# Patient Record
Sex: Male | Born: 2000 | Hispanic: No | Marital: Single | Smoking: Current some day smoker
Health system: Southern US, Community
[De-identification: ages and names within clinical notes are randomized; demographics above are authoritative.]

---
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
## 2018-04-20 ENCOUNTER — Emergency Department (HOSPITAL_COMMUNITY)
Admission: EM | Admit: 2018-04-20 | Discharge: 2018-04-21 | Disposition: A | Discharge status: Home / Self Care | Payer: Medicaid Other | Attending: Emergency Medicine | Admitting: Emergency Medicine

## 2018-04-20 ENCOUNTER — Encounter (HOSPITAL_COMMUNITY): Payer: Self-pay | Admitting: Emergency Medicine

## 2018-04-20 ENCOUNTER — Emergency Department (HOSPITAL_COMMUNITY): Payer: Medicaid Other

## 2018-04-20 ENCOUNTER — Other Ambulatory Visit: Payer: Self-pay

## 2018-04-20 DIAGNOSIS — S20219A Contusion of unspecified front wall of thorax, initial encounter: Secondary | ICD-10-CM | POA: Diagnosis not present

## 2018-04-20 DIAGNOSIS — Y939 Activity, unspecified: Secondary | ICD-10-CM | POA: Insufficient documentation

## 2018-04-20 DIAGNOSIS — Y929 Unspecified place or not applicable: Secondary | ICD-10-CM | POA: Diagnosis not present

## 2018-04-20 DIAGNOSIS — R1032 Left lower quadrant pain: Secondary | ICD-10-CM | POA: Insufficient documentation

## 2018-04-20 DIAGNOSIS — Y999 Unspecified external cause status: Secondary | ICD-10-CM | POA: Insufficient documentation

## 2018-04-20 DIAGNOSIS — X58XXXA Exposure to other specified factors, initial encounter: Secondary | ICD-10-CM | POA: Diagnosis not present

## 2018-04-20 DIAGNOSIS — S299XXA Unspecified injury of thorax, initial encounter: Secondary | ICD-10-CM | POA: Diagnosis present

## 2018-04-20 LAB — URINALYSIS, ROUTINE W REFLEX MICROSCOPIC
BILIRUBIN URINE: NEGATIVE
GLUCOSE, UA: NEGATIVE mg/dL
HGB URINE DIPSTICK: NEGATIVE
KETONES UR: 5 mg/dL — AB
Leukocytes, UA: NEGATIVE
Nitrite: NEGATIVE
PROTEIN: NEGATIVE mg/dL
Specific Gravity, Urine: 1.019 (ref 1.005–1.030)
pH: 5 (ref 5.0–8.0)

## 2018-04-20 MED ORDER — IBUPROFEN 600 MG PO TABS: 600.0000 mg | ORAL_TABLET | Freq: Four times a day (QID) | ORAL | 0 refills | Status: DC | PRN

## 2018-04-20 NOTE — Discharge Instructions (Signed)
Return if any problems.
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
## 2018-04-20 NOTE — ED Notes (Signed)
ED Provider at bedside. 

## 2020-12-17 ENCOUNTER — Encounter (HOSPITAL_COMMUNITY): Payer: Self-pay

## 2020-12-17 ENCOUNTER — Emergency Department (HOSPITAL_COMMUNITY): Payer: Medicaid Other

## 2020-12-17 ENCOUNTER — Emergency Department (HOSPITAL_COMMUNITY)
Admission: EM | Admit: 2020-12-17 | Discharge: 2020-12-17 | Discharge status: Against Medical Advice | Payer: Medicaid Other | Attending: Physician Assistant | Admitting: Physician Assistant

## 2020-12-17 ENCOUNTER — Other Ambulatory Visit: Payer: Self-pay

## 2020-12-17 DIAGNOSIS — M25531 Pain in right wrist: Secondary | ICD-10-CM | POA: Insufficient documentation

## 2020-12-17 DIAGNOSIS — W3189XA Contact with other specified machinery, initial encounter: Secondary | ICD-10-CM | POA: Diagnosis not present

## 2020-12-17 DIAGNOSIS — R202 Paresthesia of skin: Secondary | ICD-10-CM | POA: Insufficient documentation

## 2020-12-17 DIAGNOSIS — Z5321 Procedure and treatment not carried out due to patient leaving prior to being seen by health care provider: Secondary | ICD-10-CM | POA: Diagnosis not present

## 2020-12-17 NOTE — ED Notes (Signed)
Called pt to take to room. Unable to locate
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
## 2020-12-17 NOTE — ED Provider Notes (Signed)
Emergency Medicine Provider Triage Evaluation Note  Noah [name] , a 20 y.o. male  was evaluated in triage.  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 Felt "bones pop on both sides of the wrist". Significant pain with movement. H/o fracture to the same wrist several years ago. No laceration   Review of Systems  Positive: As above Negative: As above  Physical Exam  There were no vitals taken for this visit. Gen:   Awake, no distress   Resp:  Normal effort  MSK:   right wrist swelling, 2+ radial pulses, sensations intact Other:  Medical Decision Making  Medically screening exam initiated at 2:35 PM.  Appropriate orders placed.  Carlena Hurl was informed that the remainder of the evaluation will be completed by another provider, this initial triage assessment does not replace that evaluation, and the importance of remaining in the ED until their evaluation is complete.     Mare Ferrari, PA-C 12/17/20 1438    Ernie Avena, MD 12/17/20 2146

## 2020-12-17 NOTE — ED Notes (Signed)
Called patient to move to room, unable to locate at this time. 

## 2021-01-17 ENCOUNTER — Emergency Department (EMERGENCY_DEPARTMENT_HOSPITAL)
Admission: EM | Admit: 2021-01-17 | Discharge: 2021-01-18 | Disposition: A | Discharge status: Other Acute Inpatient Hospital | Payer: Medicaid Other | Source: Home / Self Care | Attending: Emergency Medicine | Admitting: Emergency Medicine

## 2021-01-17 ENCOUNTER — Other Ambulatory Visit: Payer: Self-pay

## 2021-01-17 DIAGNOSIS — F29 Unspecified psychosis not due to a substance or known physiological condition: Secondary | ICD-10-CM | POA: Insufficient documentation

## 2021-01-17 DIAGNOSIS — R441 Visual hallucinations: Secondary | ICD-10-CM | POA: Insufficient documentation

## 2021-01-17 DIAGNOSIS — F3010 Manic episode without psychotic symptoms, unspecified: Secondary | ICD-10-CM

## 2021-01-17 DIAGNOSIS — Z20822 Contact with and (suspected) exposure to covid-19: Secondary | ICD-10-CM | POA: Insufficient documentation

## 2021-01-17 DIAGNOSIS — F309 Manic episode, unspecified: Secondary | ICD-10-CM | POA: Insufficient documentation

## 2021-01-17 DIAGNOSIS — F1910 Other psychoactive substance abuse, uncomplicated: Secondary | ICD-10-CM | POA: Insufficient documentation

## 2021-01-17 DIAGNOSIS — Z79899 Other long term (current) drug therapy: Secondary | ICD-10-CM | POA: Insufficient documentation

## 2021-01-17 DIAGNOSIS — R440 Auditory hallucinations: Secondary | ICD-10-CM | POA: Insufficient documentation

## 2021-01-17 DIAGNOSIS — F23 Brief psychotic disorder: Secondary | ICD-10-CM

## 2021-01-17 LAB — COMPREHENSIVE METABOLIC PANEL
ALT: 17 U/L (ref 0–44)
AST: 26 U/L (ref 15–41)
Albumin: 4.9 g/dL (ref 3.5–5.0)
Alkaline Phosphatase: 65 U/L (ref 38–126)
Anion gap: 11 (ref 5–15)
BUN: 25 mg/dL — ABNORMAL HIGH (ref 6–20)
CO2: 22 mmol/L (ref 22–32)
Calcium: 9.4 mg/dL (ref 8.9–10.3)
Chloride: 102 mmol/L (ref 98–111)
Creatinine, Ser: 1.1 mg/dL (ref 0.61–1.24)
GFR, Estimated: >60 mL/min (ref >60)
Glucose, Bld: 100 mg/dL — ABNORMAL HIGH (ref 70–99)
Potassium: 3.8 mmol/L (ref 3.5–5.1)
Sodium: 135 mmol/L (ref 135–145)
Total Bilirubin: 1 mg/dL (ref 0.3–1.2)
Total Protein: 7.9 g/dL (ref 6.5–8.1)

## 2021-01-17 LAB — CBC
HCT: 42.3 % (ref 39.0–52.0)
Hemoglobin: 14.9 g/dL (ref 13.0–17.0)
MCH: 33.2 pg (ref 26.0–34.0)
MCHC: 35.2 g/dL (ref 30.0–36.0)
MCV: 94.2 fL (ref 80.0–100.0)
Platelets: 274 10*3/uL (ref 150–400)
RBC: 4.49 MIL/uL (ref 4.22–5.81)
RDW: 12.7 % (ref 11.5–15.5)
WBC: 10.7 10*3/uL — ABNORMAL HIGH (ref 4.0–10.5)
nRBC: 0 % (ref 0.0–0.2)

## 2021-01-17 LAB — ETHANOL: Alcohol, Ethyl (B): 12 mg/dL — ABNORMAL HIGH (ref <10)

## 2021-01-17 LAB — SALICYLATE LEVEL: Salicylate Lvl: <7 mg/dL — ABNORMAL LOW (ref 7.0–30.0)

## 2021-01-17 LAB — ACETAMINOPHEN LEVEL: Acetaminophen (Tylenol), Serum: <10 ug/mL — ABNORMAL LOW (ref 10–30)

## 2021-01-17 MED ORDER — OLANZAPINE 5 MG PO TBDP
10.0000 mg | ORAL_TABLET | Freq: Once | ORAL | Status: AC
  Administered 2021-01-17: 10 mg via ORAL
  Filled 2021-01-17: qty 2

## 2021-01-17 MED ORDER — LORAZEPAM 2 MG PO TABS
2.0000 mg | ORAL_TABLET | Freq: Once | ORAL | Status: AC
  Administered 2021-01-17: 2 mg via ORAL
  Filled 2021-01-17: qty 1

## 2021-01-17 NOTE — ED Notes (Signed)
Earring placed in urine cup in belonging bag

## 2021-01-17 NOTE — ED Provider Notes (Signed)
[facility] Emergency Department Provider Note ____________________________________________   Event Date/Time   First MD Initiated Contact with Patient 01/17/21 1919     (approximate)  I have reviewed the triage vital signs and the nursing notes.  HISTORY  Chief Complaint Hallucinations   HPI Noah [name] is a 20 y.o. malewho presents to the ED for evaluation of audiovisual hallucinations.  Chart review indicates no relevant history.  Patient presents to the ED voluntarily with pressured speech, hallucinations and disorganized thoughts.  He is quite difficult to follow, making history acquisition difficult.  He reports smoking methamphetamines as recently as a couple days ago.  Also reports smoking cannabis.  Reports cocaine as recently as last year.  Adamantly denies any IVDU.  Denies SI or HI, but does report audiovisual hallucinations.  Lives with his grandparents, who are doing great.  No past medical history on file.  There are no problems to display for this patient.   No past surgical history on file.  Prior to Admission medications   Medication Sig Start Date End Date Taking? Authorizing Provider  ibuprofen (ADVIL,MOTRIN) 600 MG tablet Take 1 tablet (600 mg total) by mouth every 6 (six) hours as needed. 04/20/18   Elson Areas, PA-C    Allergies Patient has no known allergies.  No family history on file.  Social History Social History   Tobacco Use   Smoking status: Never   Smokeless tobacco: Never  Vaping Use   Vaping Use: Every day  Substance Use Topics   Alcohol use: No   Drug use: No    Review of Systems  Difficult to accurately obtain due to patient's pressured speech and disorganized thought processes. ____________________________________________   PHYSICAL EXAM:  VITAL SIGNS: Vitals:   01/17/21 1819  BP: (!) 142/100  Pulse: (!) 102  Resp: 20  Temp: 98.7 F (37.1 C)  SpO2: 98%     Constitutional:  Alert .  Slightly disheveled and difficult to follow.  Very pressured speech with tangential thought processes. Eyes: Conjunctivae are normal. PERRL. EOMI. Head: Atraumatic. Nose: No congestion/rhinnorhea. Mouth/Throat: Mucous membranes are dry.  Oropharynx non-erythematous. Neck: No stridor. No cervical spine tenderness to palpation. Cardiovascular: Normal rate, regular rhythm. Grossly normal heart sounds.  Good peripheral circulation. Respiratory: Normal respiratory effort.  No retractions. Lungs CTAB. Gastrointestinal: Soft , nondistended, nontender to palpation. No CVA tenderness. Musculoskeletal: No lower extremity tenderness nor edema.  No joint effusions. No signs of acute trauma.  No track marks noted Neurologic:  No gross focal neurologic deficits are appreciated.  Skin:  Skin is warm, dry and intact. No rash noted. Psychiatric: Mood and affect are elevated. Speech and behavior are pressured and disorganized  ____________________________________________   LABS (all labs ordered are listed, but only abnormal results are displayed)  Labs Reviewed  COMPREHENSIVE METABOLIC PANEL - Abnormal; Notable for the following components:      Result Value   Glucose, Bld 100 (*)    BUN 25 (*)    All other components within normal limits  ETHANOL - Abnormal; Notable for the following components:   Alcohol, Ethyl (B) 12 (*)    All other components within normal limits  SALICYLATE LEVEL - Abnormal; Notable for the following components:   Salicylate Lvl <7.0 (*)    All other components within normal limits  ACETAMINOPHEN LEVEL - Abnormal; Notable for the following components:   Acetaminophen (Tylenol), Serum <10 (*)    All other components within normal limits  CBC -  Abnormal; Notable for the following components:   WBC 10.7 (*)    All other components within normal limits  RESP PANEL BY RT-PCR (FLU A&B, COVID) ARPGX2  URINE DRUG SCREEN, QUALITATIVE ([facility] ONLY)    ____________________________________________  12 Lead EKG   ____________________________________________  RADIOLOGY  ED MD interpretation:    Official radiology report(s): No results found.  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  No evidence of medical pathology to preclude psychiatric evaluation and disposition.  Blood work is benign.  He calmed down with some Ativan and Zyprexa p.o.  We will hold under IVC for psychiatric valuation.  Clinical Course as of 01/17/21 2150  Tue Jan 17, 2021  2147 The patient has been placed in psychiatric observation due to the need to provide a safe environment for the patient while obtaining psychiatric consultation and evaluation, as well as ongoing medical and medication management to treat the patient's condition.  The patient has been placed under full IVC at this time.   [DS]    Clinical Course User Index [DS] Delton Prairie, MD    ____________________________________________   FINAL CLINICAL IMPRESSION(S) / ED DIAGNOSES  Final diagnoses:  Manic behavior [facility])     ED Discharge Orders     None        Seanpaul Preece   Note:  This document was prepared using Dragon voice recognition software and may include unintentional dictation errors.    Delton Prairie, MD 01/17/21 2150

## 2021-01-17 NOTE — ED Notes (Signed)
Patient in room screaming and shouting. Writer went into room to try and calm patient. Patient Field seismologist, "Bitch" patient punching bed and walls and appears to be responding to internal stimuli. Once time order for zyprexa given per order.

## 2021-01-17 NOTE — ED Notes (Signed)
Orange Shirt, Audiological scientist, black belt, black and white shorts, black boxers, white socks, black slides

## 2021-01-17 NOTE — ED Notes (Signed)
Pt. Alert and oriented, warm and dry, in no distress. Pt. Denies SI, HI. Patient states having hallucinations. Patient states seeing multiple people and hearing voices. Patient has rapid speech that is pressured. Patient states he only had a sip of beer and last smoked weed a day or so ago. Writer explained the process to patient, and patient was agreeable.  Pt. Encouraged to let nursing staff know of any concerns or needs.

## 2021-01-17 NOTE — ED Triage Notes (Signed)
Pt is rambling and states that he is having hallucinations. He is tearful and  not sure what he is here for. He is talking to people that are not here. Pt reports that he has not taken any medication and that he has been clean for "awhile"

## 2021-01-18 ENCOUNTER — Inpatient Hospital Stay: Admission: AD | Admit: 2021-01-18 | Payer: Medicaid Other | Source: Intra-hospital | Admitting: Behavioral Health

## 2021-01-18 ENCOUNTER — Encounter: Payer: Self-pay | Admitting: Behavioral Health

## 2021-01-18 ENCOUNTER — Inpatient Hospital Stay
Admission: AD | Admit: 2021-01-18 | Discharge: 2021-02-01 | DRG: 885 | Disposition: A | Discharge status: Home / Self Care | Payer: Medicaid Other | Source: Intra-hospital | Attending: Behavioral Health | Admitting: Behavioral Health

## 2021-01-18 DIAGNOSIS — S90821A Blister (nonthermal), right foot, initial encounter: Secondary | ICD-10-CM | POA: Diagnosis present

## 2021-01-18 DIAGNOSIS — F1910 Other psychoactive substance abuse, uncomplicated: Secondary | ICD-10-CM

## 2021-01-18 DIAGNOSIS — F23 Brief psychotic disorder: Principal | ICD-10-CM | POA: Diagnosis present

## 2021-01-18 DIAGNOSIS — F17210 Nicotine dependence, cigarettes, uncomplicated: Secondary | ICD-10-CM | POA: Diagnosis present

## 2021-01-18 DIAGNOSIS — Z20822 Contact with and (suspected) exposure to covid-19: Secondary | ICD-10-CM | POA: Diagnosis present

## 2021-01-18 DIAGNOSIS — S90822A Blister (nonthermal), left foot, initial encounter: Secondary | ICD-10-CM | POA: Diagnosis present

## 2021-01-18 DIAGNOSIS — G4700 Insomnia, unspecified: Secondary | ICD-10-CM | POA: Diagnosis present

## 2021-01-18 LAB — RESP PANEL BY RT-PCR (FLU A&B, COVID) ARPGX2
Influenza A by PCR: NEGATIVE
Influenza B by PCR: NEGATIVE
SARS Coronavirus 2 by RT PCR: NEGATIVE

## 2021-01-18 MED ORDER — OLANZAPINE 5 MG PO TBDP
10.0000 mg | ORAL_TABLET | Freq: Two times a day (BID) | ORAL | Status: DC
  Administered 2021-01-19 – 2021-01-21 (×5): 10 mg via ORAL
  Filled 2021-01-18 (×5): qty 2

## 2021-01-18 MED ORDER — ZIPRASIDONE MESYLATE 20 MG IM SOLR: 20.0000 mg | Freq: Four times a day (QID) | INTRAMUSCULAR | Status: DC | PRN

## 2021-01-18 MED ORDER — IBUPROFEN 600 MG PO TABS
600.0000 mg | ORAL_TABLET | Freq: Four times a day (QID) | ORAL | Status: DC | PRN
  Administered 2021-01-19 – 2021-02-01 (×7): 600 mg via ORAL
  Filled 2021-01-18 (×8): qty 1

## 2021-01-18 MED ORDER — HYDROXYZINE HCL 50 MG PO TABS
50.0000 mg | ORAL_TABLET | Freq: Three times a day (TID) | ORAL | Status: DC | PRN
  Administered 2021-01-19 – 2021-02-01 (×27): 50 mg via ORAL
  Filled 2021-01-18 (×31): qty 1

## 2021-01-18 MED ORDER — OLANZAPINE 5 MG PO TBDP
10.0000 mg | ORAL_TABLET | Freq: Two times a day (BID) | ORAL | Status: DC
  Administered 2021-01-18: 10 mg via ORAL
  Filled 2021-01-18: qty 2

## 2021-01-18 MED ORDER — ALUM & MAG HYDROXIDE-SIMETH 200-200-20 MG/5ML PO SUSP
30.0000 mL | ORAL | Status: DC | PRN
  Administered 2021-01-24: 30 mL via ORAL
  Filled 2021-01-18: qty 30

## 2021-01-18 MED ORDER — TRAZODONE HCL 100 MG PO TABS
100.0000 mg | ORAL_TABLET | Freq: Every evening | ORAL | Status: DC | PRN
  Administered 2021-01-19 – 2021-01-31 (×13): 100 mg via ORAL
  Filled 2021-01-18 (×14): qty 1

## 2021-01-18 MED ORDER — MAGNESIUM HYDROXIDE 400 MG/5ML PO SUSP: 30.0000 mL | Freq: Every day | ORAL | Status: DC | PRN

## 2021-01-18 MED ORDER — OLANZAPINE 10 MG PO TABS
10.0000 mg | ORAL_TABLET | Freq: Four times a day (QID) | ORAL | Status: DC | PRN
  Administered 2021-01-19 – 2021-01-31 (×12): 10 mg via ORAL
  Filled 2021-01-18 (×13): qty 1

## 2021-01-18 NOTE — Tx Team (Signed)
Initial Treatment Plan 01/18/2021 3:01 PM Noah [name] [id number]    PATIENT STRESSORS: Medication change or noncompliance   Substance abuse     PATIENT STRENGTHS: Ability for insight  Communication skills  Physical Health  Supportive family/friends    PATIENT IDENTIFIED PROBLEMS: Psychosis    Substance abuse                  DISCHARGE CRITERIA:  Ability to meet basic life and health needs Improved stabilization in mood, thinking, and/or behavior Motivation to continue treatment in a less acute level of care Verbal commitment to aftercare and medication compliance  PRELIMINARY DISCHARGE PLAN: Outpatient therapy Return to previous living arrangement  PATIENT/FAMILY INVOLVEMENT: This treatment plan has been presented to and reviewed with the patient, Noah [name], .  The patient has been given the opportunity to ask questions and make suggestions.  Chalmers Cater, RN 01/18/2021, 3:01 PM

## 2021-01-18 NOTE — Plan of Care (Signed)
New admission Problem: Education: Goal: Knowledge of [facility] Education information/materials will improve Outcome: Not Progressing Goal: Emotional status will improve Outcome: Not Progressing Goal: Mental status will improve Outcome: Not Progressing Goal: Verbalization of understanding the information provided will improve Outcome: Not Progressing   Problem: Activity: Goal: Interest or engagement in activities will improve Outcome: Not Progressing Goal: Sleeping patterns will improve Outcome: Not Progressing   Problem: Coping: Goal: Ability to verbalize frustrations and anger appropriately will improve Outcome: Not Progressing Goal: Ability to demonstrate self-control will improve Outcome: Not Progressing   Problem: Health Behavior/Discharge Planning: Goal: Identification of resources available to assist in meeting health care needs will improve Outcome: Not Progressing Goal: Compliance with treatment plan for underlying cause of condition will improve Outcome: Not Progressing   Problem: Physical Regulation: Goal: Ability to maintain clinical measurements within normal limits will improve Outcome: Not Progressing   Problem: Safety: Goal: Periods of time without injury will increase Outcome: Not Progressing   Problem: Activity: Goal: Will verbalize the importance of balancing activity with adequate rest periods Outcome: Not Progressing   Problem: Education: Goal: Will be free of psychotic symptoms Outcome: Not Progressing Goal: Knowledge of the prescribed therapeutic regimen will improve Outcome: Not Progressing   Problem: Coping: Goal: Coping ability will improve Outcome: Not Progressing Goal: Will verbalize feelings Outcome: Not Progressing   Problem: Health Behavior/Discharge Planning: Goal: Compliance with prescribed medication regimen will improve Outcome: Not Progressing   Problem: Nutritional: Goal: Ability to achieve adequate nutritional  intake will improve Outcome: Not Progressing   Problem: Role Relationship: Goal: Ability to communicate needs accurately will improve Outcome: Not Progressing Goal: Ability to interact with others will improve Outcome: Not Progressing   Problem: Safety: Goal: Ability to redirect hostility and anger into socially appropriate behaviors will improve Outcome: Not Progressing Goal: Ability to remain free from injury will improve Outcome: Not Progressing   Problem: Self-Care: Goal: Ability to participate in self-care as condition permits will improve Outcome: Not Progressing   Problem: Self-Concept: Goal: Will verbalize positive feelings about self Outcome: Not Progressing   Problem: Education: Goal: Knowledge of disease or condition will improve Outcome: Not Progressing Goal: Understanding of discharge needs will improve Outcome: Not Progressing   Problem: Health Behavior/Discharge Planning: Goal: Ability to identify changes in lifestyle to reduce recurrence of condition will improve Outcome: Not Progressing Goal: Identification of resources available to assist in meeting health care needs will improve Outcome: Not Progressing   Problem: Physical Regulation: Goal: Complications related to the disease process, condition or treatment will be avoided or minimized Outcome: Not Progressing   Problem: Safety: Goal: Ability to remain free from injury will improve Outcome: Not Progressing

## 2021-01-18 NOTE — ED Notes (Signed)
Report to Amanda, RN in BMU. 

## 2021-01-18 NOTE — BH Assessment (Signed)
Comprehensive Clinical Assessment (CCA) Screening, Triage and Referral Note  01/18/2021 Noah [name] [id number]  Noah [name], 20 year old male who presents to [facility] ED voluntarily for treatment. Per triage note, Pt is rambling and states that he is having hallucinations. He is tearful and  not sure what he is here for. He is talking to people that are not here. Pt reports that he has not taken any medication and that he has been clean for "awhile".   During TTS assessment pt presents alert and oriented x 4, restless but cooperative, and mood-congruent with affect. The pt appears to be responding to internal stimuli. Pt is presenting with delusional thinking.   During the interview it was very difficult to understand patient. Patient presented with rapid speech and became very agitated as he was talking. Patient reports he is not taking any current medications; however, when asking about substance use, patient was unclear. At one point patient would say he recently used Meth, marijuana and took a couple of Xanacs off the street but in another statement he would say he was "clean".  Patient denies SI yet he was saying that he wanted to hurt someone named [place]. Patient reports INPT hx at [facility] and he does not see a therapist for outpatient treatment. Patient was labile and upset throughout the assessment. Patient was looking past Psych Team as if someone else was in the room.     Per Dr. Toni Amend pt is recommended for inpatient psychiatric admission.    Chief Complaint:  No chief complaint on file.  Visit Diagnosis: Acute psychosis  Patient Reported Information How did you hear about [place]? Self  What Is the Reason for Your Visit/Call Today? Patient was brought to the ED for having hallucinations.  How Long Has This Been Causing You Problems? <Week  What Do You Feel Would Help You the Most Today? Medication(s) (Assessment)   Have You Recently Had Any Thoughts About Hurting Yourself?  No  Are You Planning to Commit Suicide/Harm Yourself At This time? No   Have you Recently Had Thoughts About Hurting Someone Karolee Ohs? Yes  Are You Planning to Harm Someone at This Time? No  Explanation: No data recorded  Have You Used Any Alcohol or Drugs in the Past 24 Hours? No data recorded How Long Ago Did You Use Drugs or Alcohol? No data recorded What Did You Use and How Much? No data recorded  Do You Currently Have a Therapist/Psychiatrist? No  Name of Therapist/Psychiatrist: No data recorded  Have You Been Recently Discharged From Any Office Practice or Programs? No  Explanation of Discharge From Practice/Program: No data recorded   CCA Screening Triage Referral Assessment Type of Contact: Face-to-Face  Telemedicine Service Delivery:   Is this Initial or Reassessment? No data recorded Date Telepsych consult ordered in [facility]:  No data recorded Time Telepsych consult ordered in [facility]:  No data recorded Location of Assessment: [facility] ED  Provider Location: [facility] ED   Collateral Involvement: None provided   Does Patient Have a Court Appointed Legal Guardian? No data recorded Name and Contact of Legal Guardian: No data recorded If Minor and Not Living with Parent(s), Who has Custody? n/a  Is CPS involved or ever been involved? No data recorded Is APS involved or ever been involved? No data recorded  Patient Determined To Be At Risk for Harm To Self or Others Based on Review of Patient Reported Information or Presenting Complaint? No  Method: No data recorded Availability of Means:  No data recorded Intent: No data recorded Notification Required: No data recorded Additional Information for Danger to Others Potential: No data recorded Additional Comments for Danger to Others Potential: No data recorded Are There Guns or Other Weapons in Your Home? No data recorded Types of Guns/Weapons: No data recorded Are These Weapons Safely Secured?                            No data  recorded Who Could Verify You Are Able To Have These Secured: No data recorded Do You Have any Outstanding Charges, Pending Court Dates, Parole/Probation? No data recorded Contacted To Inform of Risk of Harm To Self or Others: No data recorded  Does Patient Present under Involuntary Commitment? Yes  IVC Papers Initial File Date: 01/18/21   [place] of Residence: [place]   Patient Currently Receiving the Following Services: Not Receiving Services   Determination of Need: Urgent (48 hours)   Options For Referral: ED Visit; Inpatient Hospitalization; Medication Management   Discharge Disposition:     Clerance Lav, Counselor, LCAS-A

## 2021-01-18 NOTE — ED Notes (Signed)
Meal given to pt.

## 2021-01-18 NOTE — ED Notes (Signed)
IVC, pending consult 

## 2021-01-18 NOTE — Consult Note (Signed)
[facility]   Reason for Consult: Consult for 20 year old man came to the emergency room last night very agitated disorganized with evident hallucinations and psychotic symptoms. Referring Physician: Katrinka BlazingSmith Patient Identification: Noah [name] Nest MRN:  [id number] Principal Diagnosis: Acute psychosis (HCC) Diagnosis:  Principal Problem:   Acute psychosis (HCC) Active Problems:   Polysubstance abuse (HCC)   Total Time spent with patient: 1 hour  Subjective:   Noah [name] Warn is a 20 y.o. male patient admitted with "it is [place] and all of that shit".  HPI: Patient seen chart reviewed.  20 year old came to the emergency room last night initially apparently voluntarily.  Was described as emotionally labile and agitated.  On interview today the patient was showing a very labile affect.  Expressing anger.  Rapid pressured speech.  Only partially understandable.  Appears to be delusional and responding to internal stimuli.  Frequently turning around addressing different parts of the room.  Talking about Rudell CobbFreddie and Barbara CowerJason and horror movies.  Patient is unable to give a very clear history about substance abuse.  He tells us that he has been "clean" for "a while" but then starts talking about using meth and weed and taking pills like Xanax is that he gets off the street.  No drug screen has been done yet.  Patient is denying suicidal ideation.  Asked about homicidal ideation he says that he does and starts talking about somebody named Clide CliffRicky but will not answer questions about who that is.  Patient denies currently being on any medication.  Past Psychiatric History: We do not have any psychiatric records available but the patient indicates he has had psychiatric treatment in the fact past.  [facility].  Could not name any medications.  Risk to Self:   Risk to Others:   Prior Inpatient Therapy:   Prior Outpatient Therapy:    Past  Medical History: No past medical history on file. No past surgical history on file. Family History: No family history on file. Family Psychiatric  History: Unknown Social History:  Social History   Substance and Sexual Activity  Alcohol Use No     Social History   Substance and Sexual Activity  Drug Use No    Social History   Socioeconomic History   Marital status: Single    Spouse name: Not on file   Number of children: Not on file   Years of education: Not on file   Highest education level: Not on file  Occupational History   Not on file  Tobacco Use   Smoking status: Never   Smokeless tobacco: Never  Vaping Use   Vaping Use: Every day  Substance and Sexual Activity   Alcohol use: No   Drug use: No   Sexual activity: Not on file  Other Topics Concern   Not on file  Social History Narrative   Not on file   Social Determinants of Health   Financial Resource Strain: Not on file  Food Insecurity: Not on file  Transportation Needs: Not on file  Physical Activity: Not on file  Stress: Not on file  Social Connections: Not on file   Additional Social History:    Allergies:  No Known Allergies  Labs:  Results for orders placed or performed during the hospital encounter of 01/17/21 (from the past 48 hour(s))  Comprehensive metabolic panel     Status: Abnormal   Collection Time: 01/17/21  6:25 PM  Result Value Ref Range   Sodium 135 135 - 145 mmol/[name]   Potassium 3.8 3.5 - 5.1 mmol/[name]   Chloride 102 98 - 111 mmol/[name]   CO2 22 22 - 32 mmol/[name]   Glucose, Bld 100 (H) 70 - 99 mg/dL    Comment: Glucose reference range applies only to samples taken after fasting for at least 8 hours.   BUN 25 (H) 6 - 20 mg/dL   Creatinine, Ser 0.60 0.61 - 1.24 mg/dL   Calcium 9.4 8.9 - 04.5 mg/dL   Total Protein 7.9 6.5 - 8.1 g/dL   Albumin 4.9 3.5 - 5.0 g/dL   AST 26 15 - 41 U/[name]   ALT 17 0 - 44 U/[name]   Alkaline Phosphatase 65 38 - 126 U/[name]   Total Bilirubin 1.0 0.3 - 1.2 mg/dL   GFR,  Estimated >99 >77 mL/min    Comment: (NOTE) Calculated using the CKD-EPI Creatinine Equation (2021)    Anion gap 11 5 - 15    Comment: Performed at [facility], [address], [place], [place] [address]  Ethanol     Status: Abnormal   Collection Time: 01/17/21  6:25 PM  Result Value Ref Range   Alcohol, Ethyl (B) 12 (H) <10 mg/dL    Comment: (NOTE) Lowest detectable limit for serum alcohol is 10 mg/dL.  For medical purposes only. Performed at [facility], [address] [place], [place] [address]   Salicylate level     Status: Abnormal   Collection Time: 01/17/21  6:25 PM  Result Value Ref Range   Salicylate Lvl <7.0 ([name]) 7.0 - 30.0 mg/dL    Comment: Performed at [facility], [address] [place], [place] [address]  Acetaminophen level     Status: Abnormal   Collection Time: 01/17/21  6:25 PM  Result Value Ref Range   Acetaminophen (Tylenol), Serum <10 ([name]) 10 - 30 ug/mL    Comment: (NOTE) Therapeutic concentrations vary significantly. A range of 10-30 ug/mL  may be an effective concentration for many patients. However, some  are best treated at concentrations outside of this range. Acetaminophen concentrations >150 ug/mL at 4 hours after ingestion  and >50 ug/mL at 12 hours after ingestion are often associated with  toxic reactions.  Performed at [facility], [address] [place], [place] [address]   cbc     Status: Abnormal   Collection Time: 01/17/21  6:25 PM  Result Value Ref Range   WBC 10.7 (H) 4.0 - 10.5 K/uL   RBC 4.49 4.22 - 5.81 MIL/uL   Hemoglobin 14.9 13.0 - 17.0 g/dL   HCT 68.3 72.9 - 02.1 %   MCV 94.2 80.0 - 100.0 fL   MCH 33.2 26.0 - 34.0 pg   MCHC 35.2 30.0 - 36.0 g/dL   RDW 11.5 52.0 - 80.2 %   Platelets 274 150 - 400 K/uL   nRBC 0.0 0.0 - 0.2 %    Comment: Performed at [facility], [address], [place], [place] [address]    Current Facility-Administered Medications  Medication Dose  Route Frequency Provider Last Rate Last Admin   OLANZapine zydis (ZYPREXA) disintegrating tablet 10 mg  10 mg Oral BID Issa Kosmicki, Jackquline Denmark, MD       Current Outpatient Medications  Medication Sig Dispense Refill   ibuprofen (ADVIL,MOTRIN) 600 MG tablet Take 1 tablet (600 mg total) by mouth every 6 (six) hours as needed. (Patient not taking: No sig reported) 30 tablet  0    Musculoskeletal: Strength & Muscle Tone: within normal limits Gait & Station: normal Patient leans: N/A            Psychiatric Specialty Exam:  Presentation  General Appearance:  No data recorded Eye Contact: No data recorded Speech: No data recorded Speech Volume: No data recorded Handedness: No data recorded  Mood and Affect  Mood: No data recorded Affect: No data recorded  Thought Process  Thought Processes: No data recorded Descriptions of Associations:No data recorded Orientation:No data recorded Thought Content:No data recorded History of Schizophrenia/Schizoaffective disorder:No data recorded Duration of Psychotic Symptoms:No data recorded Hallucinations:No data recorded Ideas of Reference:No data recorded Suicidal Thoughts:No data recorded Homicidal Thoughts:No data recorded  Sensorium  Memory: No data recorded Judgment: No data recorded Insight: No data recorded  Executive Functions  Concentration: No data recorded Attention Span: No data recorded Recall: No data recorded Fund of Knowledge: No data recorded Language: No data recorded  Psychomotor Activity  Psychomotor Activity: No data recorded  Assets  Assets: No data recorded  Sleep  Sleep: No data recorded  Physical Exam: Physical Exam Vitals and nursing note reviewed.  Constitutional:      Appearance: Normal appearance.  HENT:     Head: Normocephalic and atraumatic.     Mouth/Throat:     Pharynx: Oropharynx is clear.  Eyes:     Pupils: Pupils are equal, round, and reactive to light.   Cardiovascular:     Rate and Rhythm: Normal rate and regular rhythm.  Pulmonary:     Effort: Pulmonary effort is normal.     Breath sounds: Normal breath sounds.  Abdominal:     General: Abdomen is flat.     Palpations: Abdomen is soft.  Musculoskeletal:        General: Normal range of motion.  Skin:    General: Skin is warm and dry.  Neurological:     General: No focal deficit present.     Mental Status: He is alert. Mental status is at baseline.  Psychiatric:        Attention and Perception: He is inattentive.        Mood and Affect: Mood normal. Affect is labile and angry.        Speech: Speech is rapid and pressured and tangential.        Behavior: Behavior is agitated. Behavior is not aggressive.        Thought Content: Thought content is delusional.        Cognition and Memory: Cognition is impaired. Memory is impaired.        Judgment: Judgment is inappropriate.   Review of Systems  Constitutional: Negative.   HENT: Negative.    Eyes: Negative.   Respiratory: Negative.    Cardiovascular: Negative.   Gastrointestinal: Negative.   Musculoskeletal: Negative.   Skin: Negative.   Neurological: Negative.   Psychiatric/Behavioral:  Positive for hallucinations, memory loss and substance abuse. Negative for depression and suicidal ideas. The patient is nervous/anxious and has insomnia.   Blood pressure 127/78, pulse 78, temperature 98.1 F (36.7 C), temperature source Oral, resp. rate 17, height 5\' 8"  (1.727 m), weight 54 kg, SpO2 98 %. Body mass index is 18.09 kg/m.  Treatment Plan Summary: Medication management and Plan 20 year old man who currently presents with psychotic symptoms with manic like features including pressured speech mood lability and agitation.  He has been cooperative with treatment in the emergency room and is currently calm and stating he will take medication.  Labs only partially back no drug screen yet as a urine has not been obtained.  Differential  diagnosis of psychosis particularly manic psychosis versus substance induced.  Patient requires inpatient hospitalization because of psychosis.  Continue IVC which was initiated in the ER.  Orders placed for 10 mg Zyprexa twice a day starting today.  Case to be reviewed with inpatient team and TTS with recommendations for hospitalization.  Attempted to call the contact number in the chart listed as his mother but there was no answer  Disposition: Recommend psychiatric Inpatient admission when medically cleared. Supportive therapy provided about ongoing stressors.  Mordecai Rasmussen, MD 01/18/2021 11:16 AM

## 2021-01-18 NOTE — ED Provider Notes (Signed)
Emergency Medicine Observation Re-evaluation Note  Noah [name] is a 20 y.o. male, seen on rounds today.  Pt initially presented to the ED for complaints of Hallucinations Currently, the patient is sleeping.  Physical Exam  BP (!) 142/100 (BP Location: Right Arm)   Pulse (!) 102   Temp 98.7 F (37.1 C) (Oral)   Resp 20   Ht 5\' 8"  (1.727 m)   Wt 54 kg   SpO2 98%   BMI 18.09 kg/m  Physical Exam Gen: No acute distress  Resp: Normal rise and fall of chest Neuro: Moving all four extremities Psych: Resting currently, calm and cooperative when awake    ED Course / MDM  EKG:   I have reviewed the labs performed to date as well as medications administered while in observation.  Recent changes in the last 24 hours include no acute events.  Plan  Current plan is for psychiatric evaluation for further disposition. Noah [name] is under involuntary commitment.      Joylynn Defrancesco, Carlena Hurl, DO 01/18/21 [phone]

## 2021-01-18 NOTE — Plan of Care (Signed)
  Problem: Education: Goal: Knowledge of [facility] Education information/materials will improve Outcome: Not Progressing Goal: Verbalization of understanding the information provided will improve Outcome: Not Progressing   Problem: Activity: Goal: Interest or engagement in activities will improve Outcome: Not Progressing

## 2021-01-18 NOTE — BH Assessment (Signed)
Patient is to be admitted to [facility] by Dr. Toni Amend.  Attending Physician will be Dr. Neale Burly.   Patient has been assigned to room 319, by [facility] Charge Nurse Marchelle Folks.     ER staff is aware of the admission: Annette,ER Secretary   Dr. Katrinka Blazing, ER MD  Connye Burkitt, Patient's Nurse  Sue Lush, Patient Access.

## 2021-01-18 NOTE — Progress Notes (Signed)
Patient affect is angry. Thoughts are disorganized and speech is rapid and pressured. Patient is difficult to understand. He states that he "doesn't really" drink alcohol and smokes from time to time. He endorses using marijuana. He is vague about frequency and amount of substance abuse. Patient denies suicidal ideations, homicidal ideations, and auditory and visual hallucinations. However, patient is observed to be responding to internal stimuli and is talking to himself. Patient states that he wants to work on himself and needs medications. He lives with his grandparents and identifies them as his support system.   Skin assessment was done with Aundra Millet, RN. No contraband found. Patient remains safe on the unit at this time.

## 2021-01-18 NOTE — BH Assessment (Signed)
This Clinical research associate made several attempts to arouse, pt along with psych NP Annice Pih T. Pt unable to participate in the assessment due to being medicated. TTS to follow up.

## 2021-01-18 NOTE — Group Note (Signed)
[facility] LCSW Group Therapy Note   Group Date: 01/18/2021 Start Time: 1330 End Time: 1430   Type of Therapy/Topic:  Group Therapy:  Emotion Regulation  Participation Level:  Did Not Attend    Description of Group:    The purpose of this group is to assist patients in learning to regulate negative emotions and experience positive emotions. Patients will be guided to discuss ways in which they have been vulnerable to their negative emotions. These vulnerabilities will be juxtaposed with experiences of positive emotions or situations, and patients challenged to use positive emotions to combat negative ones. Special emphasis will be placed on coping with negative emotions in conflict situations, and patients will process healthy conflict resolution skills.  Therapeutic Goals: Patient will identify two positive emotions or experiences to reflect on in order to balance out negative emotions:  Patient will label two or more emotions that they find the most difficult to experience:  Patient will be able to demonstrate positive conflict resolution skills through discussion or role plays:   Summary of Patient Progress: X    Therapeutic Modalities:   Cognitive Behavioral Therapy Feelings Identification Dialectical Behavioral Therapy   Deola Rewis A [place], LCSWA

## 2021-01-19 DIAGNOSIS — F23 Brief psychotic disorder: Principal | ICD-10-CM

## 2021-01-19 LAB — LIPID PANEL
Cholesterol: 155 mg/dL (ref 0–200)
HDL: 58 mg/dL (ref >40)
LDL Cholesterol: 78 mg/dL (ref 0–99)
Total CHOL/HDL Ratio: 2.7 RATIO
Triglycerides: 95 mg/dL (ref <150)
VLDL: 19 mg/dL (ref 0–40)

## 2021-01-19 LAB — HEMOGLOBIN A1C
Hgb A1c MFr Bld: 5.3 % (ref 4.8–5.6)
Mean Plasma Glucose: 105.41 mg/dL

## 2021-01-19 LAB — TSH: TSH: 4.528 u[IU]/mL — ABNORMAL HIGH (ref 0.350–4.500)

## 2021-01-19 MED ORDER — ENSURE ENLIVE PO LIQD
237.0000 mL | Freq: Three times a day (TID) | ORAL | Status: DC
  Administered 2021-01-19 – 2021-02-01 (×36): 237 mL via ORAL

## 2021-01-19 MED ORDER — ADULT MULTIVITAMIN W/MINERALS CH
1.0000 | ORAL_TABLET | Freq: Every day | ORAL | Status: DC
  Administered 2021-01-20 – 2021-02-01 (×13): 1 via ORAL
  Filled 2021-01-19 (×13): qty 1

## 2021-01-19 NOTE — Plan of Care (Signed)
Patient affect is pleasant and cooperative, behavior restless and anxious, thoughts random and disorganized with flight of ideas and speech rambling and pressured. Denies AVH, SI or HI.  Patient reports the following on Self Inventory Questionnaire:  Sleeping well with no need for medication, appetite good, energy level normal, rates depression 2/10 and anxiety 2/10.  Offered anti-anxiety med, given with relief reported.  Advil given for complaints of generalized pain with relief reported.  Patient compliant with all meds.  Reviewed POC with patient.  Questions answered and understanding verbalized. Ongoing Q15 minute safety check rounds per unit protocol.  Problem: Education: Goal: Knowledge of [facility] Education information/materials will improve Outcome: Progressing Goal: Emotional status will improve Outcome: Progressing Goal: Mental status will improve Outcome: Progressing   Problem: Health Behavior/Discharge Planning: Goal: Compliance with treatment plan for underlying cause of condition will improve Outcome: Progressing   Problem: Education: Goal: Will be free of psychotic symptoms Outcome: Progressing   Problem: Health Behavior/Discharge Planning: Goal: Compliance with prescribed medication regimen will improve Outcome: Progressing   Problem: Nutritional: Goal: Ability to achieve adequate nutritional intake will improve Outcome: Progressing   Problem: Role Relationship: Goal: Ability to communicate needs accurately will improve Outcome: Progressing   Problem: Safety: Goal: Ability to remain free from injury will improve Outcome: Progressing

## 2021-01-19 NOTE — [facility] Suicide Risk Assessment (Signed)
[facility] Admission Suicide Risk Assessment   Nursing information obtained from:  Patient Demographic factors:  Male Current Mental Status:  NA Loss Factors:  NA Historical Factors:  NA Risk Reduction Factors:  Living with another person, especially a relative  Total Time spent with patient: 1 hour Principal Problem: Acute psychosis (HCC) Diagnosis:  Principal Problem:   Acute psychosis (HCC) Active Problems:   Polysubstance abuse (HCC)  Subjective Data: 20 year old male who presented voluntarily for labile mood, agitation, and hallucinations. No acute events overnight, medication compliant, ADLs intact. Patient seen one-on-one this morning. He was pleasant on exam, though exhibited pressured speech, flight of ideas, grandiosity, and impaired memory and cognition. Today he is really only able to talk about how much he has been walking between [address], [place], and [place]. Feet examined and he has two well healing blisters on bilateral heels and a small cut near big toe bilaterally without signs of infection. He is otherwise a poor historian. He feels like [place] sounds familiar, but denies any psychiatric history or medications. He states he has used drugs in the past acid years ago, mushrooms last year, cocaine many months ago. He states he is only using mariajuana and vaping. He feels that he can do anything, and that people are just trying to keep him down. He denies any suicidal ideations, homicidal, visual hallucinations, or auditory hallucinations. He is no longer exhibiting signs of responding to internal stimuli described in the emergency room and on admission.   Continued Clinical Symptoms:  Alcohol Use Disorder Identification Test Final Score (AUDIT): 2 The "Alcohol Use Disorders Identification Test", Guidelines for Use in Primary Care, Second Edition.  World Science writer [facility]). Score between 0-7:  no or low risk or alcohol related problems. Score between 8-15:   moderate risk of alcohol related problems. Score between 16-19:  high risk of alcohol related problems. Score 20 or above:  warrants further diagnostic evaluation for alcohol dependence and treatment.   CLINICAL FACTORS:   Severe Anxiety and/or Agitation Bipolar Disorder:   Mixed State Alcohol/Substance Abuse/Dependencies Currently Psychotic Unstable or Poor Therapeutic Relationship   Musculoskeletal: Strength & Muscle Tone: within normal limits Gait & Station: normal Patient leans: N/A  Psychiatric Specialty Exam:  Presentation  General Appearance: Appropriate for Environment; Casual  Eye Contact:Good  Speech:Pressured  Speech Volume:Normal  Handedness:Right   Mood and Affect  Mood:Euphoric  Affect:Congruent   Thought Process  Thought Processes:Disorganized  Descriptions of Associations:Intact  Orientation:Full (Time, Place and Person)  Thought Content:Scattered  History of Schizophrenia/Schizoaffective disorder:No  Duration of Psychotic Symptoms:Less than six months  Hallucinations:Hallucinations: None  Ideas of Reference:Delusions  Suicidal Thoughts:Suicidal Thoughts: No  Homicidal Thoughts:Homicidal Thoughts: No   Sensorium  Memory:Immediate Poor; Recent Poor; Remote Poor  Judgment:Impaired  Insight:Lacking   Executive Functions  Concentration:Fair  Attention Span:Fair  Recall:Poor  Fund of Knowledge:Poor  Language:Fair   Psychomotor Activity  Psychomotor Activity:Psychomotor Activity: Restlessness   Assets  Assets:Financial Resources/Insurance; Physical Health   Sleep  Sleep:Sleep: Fair Number of Hours of Sleep: 7    Physical Exam: Physical Exam ROS Blood pressure 131/77, pulse 61, resp. rate 18, height 5\' 8"  (1.727 m), weight 54.4 kg, SpO2 100 %. Body mass index is 18.25 kg/m.   COGNITIVE FEATURES THAT CONTRIBUTE TO RISK:  Loss of executive function    SUICIDE RISK:   Mild:  Suicidal ideation of limited  frequency, intensity, duration, and specificity.  There are no identifiable plans, no associated intent, mild dysphoria and related symptoms, good  self-control (both objective and subjective assessment), few other risk factors, and identifiable protective factors, including available and accessible social support.  PLAN OF CARE: Continue inpatient admission, see H&P for details.   I certify that inpatient services furnished can reasonably be expected to improve the patient's condition.   Dewitt Sans, MD 01/19/2021, 10:00 AM

## 2021-01-19 NOTE — Progress Notes (Signed)
   01/19/21 1400  Clinical Encounter Type  Visited With Patient not available  Referral From Nurse  Consult/Referral To Chaplain  Spiritual Encounters  Spiritual Needs Other (Comment) (not assessed)  Chaplain Burris responded to a referral from nursing staff to provide support for Pt. Pt was sleeping soundly at time of visit. Chaplain discussed potential needs with staff and plans to attempt visit on 9/9.

## 2021-01-19 NOTE — Progress Notes (Signed)
Initial Nutrition Assessment  DOCUMENTATION CODES:   Underweight  INTERVENTION:   Ensure Enlive po TID, each supplement provides 350 kcal and 20 grams of protein  MVI po daily   NUTRITION DIAGNOSIS:   Predicted suboptimal nutrient intake related to social / environmental circumstances as evidenced by other (comment) (pt is underweight).  GOAL:   Patient will meet greater than or equal to 90% of their needs  MONITOR:   PO intake, Supplement acceptance  REASON FOR ASSESSMENT:   Malnutrition Screening Tool    ASSESSMENT:   20 year old male with h/o polysubstance abuse who presents to [facility] ED voluntarily for treatment  Suspect pt with decreased appetite and oral intake at baseline at pt is underweight. Pt eating meals and snacks in hospital. RD will add supplements and MVI to help pt meet his estimated needs. There is no documented weight history in chart to determine if any significant recent weight changes.   Medications and labs reviewed:   Diet Order:   Diet Order             Diet regular Room service appropriate? Yes; Fluid consistency: Thin  Diet effective now                  EDUCATION NEEDS:   No education needs have been identified at this time  Skin:  Skin Assessment: Reviewed RN Assessment  Last BM:  unknown  Height:   Ht Readings from Last 1 Encounters:  01/18/21 5\' 8"  (1.727 m)    Weight:   Wt Readings from Last 1 Encounters:  01/18/21 54.4 kg    Ideal Body Weight:  70 kg  BMI:  Body mass index is 18.25 kg/m.  Estimated Nutritional Needs:   Kcal:  2000-2300kcal/day  Protein:  100-115g/day  Fluid:  1.7-1.9L/day  03/20/21 MS, RD, LDN Please refer to [facility] for RD and/or RD on-call/weekend/after hours pager

## 2021-01-19 NOTE — Progress Notes (Signed)
Observed patient in day room bonding and laughing with peers. States, "It was cool. We were coming up with our own rap."  Later pt states he called his mom (his grandmother who raised him) to let her know he was in the hospital getting help.  Pt states she got upset telling him he didn't need to be there and hung up. Pt expressed concerns about where he will go when he leaves.  Emotional support given.  Requested something to "help me chill out"  Vistaril given. Will continue to monitor.

## 2021-01-19 NOTE — Progress Notes (Signed)
Patient wandering up and down halls, looking in other patients rooms. Patient pushing on exit doors trying to get out. Continuous redirecting needed.
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
## 2021-01-19 NOTE — [facility] Suicide Risk Assessment (Signed)
[facility] INPATIENT:  Family/Significant Other Suicide Prevention Education  Suicide Prevention Education: SPE completed with pt, as pt refused to consent to family contact. SPI pamphlet provided to pt and pt was encouraged to share information with support network, ask questions, and talk about any concerns relating to SPE. Pt denies access to guns/firearms and verbalized understanding of information provided. Mobile Crisis information also provided to pt.   Patient Refusal for Family/Significant Other Suicide Prevention Education: The patient Noah [name] has refused to provide written consent for family/significant other to be provided Family/Significant Other Suicide Prevention Education during admission and/or prior to discharge.  Physician notified.  Delvecchio Madole A [place] 01/19/2021, 12:02 PM

## 2021-01-19 NOTE — Group Note (Signed)
[facility] LCSW Group Therapy Note   Group Date: 01/19/2021 Start Time: 1310 End Time: 1500   Type of Therapy/Topic:  Group Therapy:  Balance in Life  Participation Level:  Did Not Attend   Description of Group:    This group will address the concept of balance and how it feels and looks when one is unbalanced. Patients will be encouraged to process areas in their lives that are out of balance, and identify reasons for remaining unbalanced. Facilitators will guide patients utilizing problem- solving interventions to address and correct the stressor making their life unbalanced. Understanding and applying boundaries will be explored and addressed for obtaining  and maintaining a balanced life. Patients will be encouraged to explore ways to assertively make their unbalanced needs known to significant others in their lives, using other group members and facilitator for support and feedback.  Therapeutic Goals: Patient will identify two or more emotions or situations they have that consume much of in their lives. Patient will identify signs/triggers that life has become out of balance:  Patient will identify two ways to set boundaries in order to achieve balance in their lives:  Patient will demonstrate ability to communicate their needs through discussion and/or role plays  Summary of Patient Progress: X  Therapeutic Modalities:   Cognitive Behavioral Therapy Solution-Focused Therapy Assertiveness Training   Tyisha Cressy R Ryszard Socarras, LCSW 

## 2021-01-19 NOTE — H&P (Signed)
Psychiatric Admission Assessment Adult  Patient Identification: Noah [name] MRN:  [id number] Date of Evaluation:  01/19/2021 Chief Complaint:  Acute psychosis (HCC) [F23] Principal Diagnosis: Acute psychosis (HCC) Diagnosis:  Principal Problem:   Acute psychosis (HCC) Active Problems:   Polysubstance abuse (HCC)  CC "I can be anything, but people are bringing me down."  History of Present Illness: 20 year old male who presented voluntarily for labile mood, agitation, and hallucinations. No acute events overnight, medication compliant, ADLs intact. Patient seen one-on-one this morning. He was pleasant on exam, though exhibited pressured speech, flight of ideas, grandiosity, and impaired memory and cognition. Today he is really only able to talk about how much he has been walking between [address], [place], and [place]. Feet examined and he has two well healing blisters on bilateral heels and a small cut near big toe bilaterally without signs of infection. He is otherwise a poor historian. He feels like [place] sounds familiar, but denies any psychiatric history or medications. He states he has used drugs in the past acid years ago, mushrooms last year, cocaine many months ago. He states he is only using mariajuana and vaping. He feels that he can do anything, and that people are just trying to keep him down. He denies any suicidal ideations, homicidal, visual hallucinations, or auditory hallucinations. He is no longer exhibiting signs of responding to internal stimuli described in the emergency room and on admission.   Call placed to number in chart for mother. No answer, no ability to leave a voicemail.   Associated Signs/Symptoms: Depression Symptoms:  insomnia, impaired memory, Duration of Depression Symptoms: Less than two weeks  (Hypo) Manic Symptoms:  Delusions, Distractibility, Elevated Mood, Grandiosity, Hallucinations, Impulsivity, Anxiety Symptoms:  Excessive  Worry, Psychotic Symptoms:  Hallucinations: Auditory PTSD Symptoms: Negative Total Time spent with patient: 1 hour  Past Psychiatric History: Patient unreliable historian, though denies any psychiatric history or any previous medication trials.   Is the patient at risk to self? Yes.    Has the patient been a risk to self in the past 6 months? Yes.    Has the patient been a risk to self within the distant past? No.  Is the patient a risk to others? Yes.    Has the patient been a risk to others in the past 6 months? No.  Has the patient been a risk to others within the distant past? No.   Prior Inpatient Therapy:   Prior Outpatient Therapy:    Alcohol Screening: 1. How often do you have a drink containing alcohol?: Monthly or less 2. How many drinks containing alcohol do you have on a typical day when you are drinking?: 1 or 2 3. How often do you have six or more drinks on one occasion?: Less than monthly AUDIT-C Score: 2 4. How often during the last year have you found that you were not able to stop drinking once you had started?: Never 5. How often during the last year have you failed to do what was normally expected from you because of drinking?: Never 6. How often during the last year have you needed a first drink in the morning to get yourself going after a heavy drinking session?: Never 7. How often during the last year have you had a feeling of guilt of remorse after drinking?: Never 8. How often during the last year have you been unable to remember what happened the night before because you had been drinking?: Never 9. Have you or someone  else been injured as a result of your drinking?: No 10. Has a relative or friend or a doctor or another health worker been concerned about your drinking or suggested you cut down?: No Alcohol Use Disorder Identification Test Final Score (AUDIT): 2 Substance Abuse History in the last 12 months:  Yes.   Consequences of Substance Abuse: Worsening  mental health Previous Psychotropic Medications:  Unknown Psychological Evaluations: No  Past Medical History: History reviewed. No pertinent past medical history. History reviewed. No pertinent surgical history. Family History: History reviewed. No pertinent family history. Family Psychiatric  History: Patient denies though unreliable historian Tobacco Screening:   Social History:  Social History   Substance and Sexual Activity  Alcohol Use No     Social History   Substance and Sexual Activity  Drug Use No    Additional Social History:                           Allergies:  No Known Allergies Lab Results:  Results for orders placed or performed during the hospital encounter of 01/18/21 (from the past 48 hour(s))  Lipid panel     Status: None   Collection Time: 01/19/21  6:38 AM  Result Value Ref Range   Cholesterol 155 0 - 200 mg/dL   Triglycerides 95 <132 mg/dL   HDL 58 >44 mg/dL   Total CHOL/HDL Ratio 2.7 RATIO   VLDL 19 0 - 40 mg/dL   LDL Cholesterol 78 0 - 99 mg/dL    Comment:        Total Cholesterol/HDL:CHD Risk Coronary Heart Disease Risk Table                     Men   Women  1/2 Average Risk   3.4   3.3  Average Risk       5.0   4.4  2 X Average Risk   9.6   7.1  3 X Average Risk  23.4   11.0        Use the calculated Patient Ratio above and the CHD Risk Table to determine the patient's CHD Risk.        ATP III CLASSIFICATION (LDL):  <100     mg/dL   Optimal  010-272  mg/dL   Near or Above                    Optimal  130-159  mg/dL   Borderline  536-644  mg/dL   High  >034     mg/dL   Very High Performed at [facility], [address] [place], [place] [address]   TSH     Status: Abnormal   Collection Time: 01/19/21  6:38 AM  Result Value Ref Range   TSH 4.528 (H) 0.350 - 4.500 uIU/mL    Comment: Performed by a 3rd Generation assay with a functional sensitivity of <=0.01 uIU/mL. Performed at [facility], [address] [place], [place] [address]     Blood Alcohol level:  Lab Results  Component Value Date   ETH 12 (H) 01/17/2021    Metabolic Disorder Labs:  No results found for: HGBA1C, MPG No results found for: PROLACTIN Lab Results  Component Value Date   CHOL 155 01/19/2021   TRIG 95 01/19/2021   HDL 58 01/19/2021   CHOLHDL 2.7 01/19/2021   VLDL 19 01/19/2021   LDLCALC 78 01/19/2021    Current  Medications: Current Facility-Administered Medications  Medication Dose Route Frequency Provider Last Rate Last Admin   alum & mag hydroxide-simeth (MAALOX/MYLANTA) 200-200-20 MG/5ML suspension 30 mL  30 mL Oral Q4H PRN Hamzah SansFreeman, Sabastien Tyler M, MD       hydrOXYzine (ATARAX/VISTARIL) tablet 50 mg  50 mg Oral TID PRN Fahim SansFreeman, Rashaud Ybarbo M, MD   50 mg at 01/19/21 0749   ibuprofen (ADVIL) tablet 600 mg  600 mg Oral Q6H PRN Nikitas SansFreeman, Casson Catena M, MD   600 mg at 01/19/21 [id number]   magnesium hydroxide (MILK OF MAGNESIA) suspension 30 mL  30 mL Oral Daily PRN Lamarkus SansFreeman, Alleen Kehm M, MD       OLANZapine [facility](ZYPREXA) tablet 10 mg  10 mg Oral Q6H PRN Trelyn SansFreeman, Kennedi Lizardo M, MD       OLANZapine zydis (ZYPREXA) disintegrating tablet 10 mg  10 mg Oral BID Sanford SansFreeman, Chett Taniguchi M, MD   10 mg at 01/19/21 0747   traZODone (DESYREL) tablet 100 mg  100 mg Oral QHS PRN Dandy SansFreeman, Infant Zink M, MD       ziprasidone (GEODON) injection 20 mg  20 mg Intramuscular Q6H PRN Derrell SansFreeman, Jozette Castrellon M, MD       PTA Medications: Medications Prior to Admission  Medication Sig Dispense Refill Last Dose   ibuprofen (ADVIL,MOTRIN) 600 MG tablet Take 1 tablet (600 mg total) by mouth every 6 (six) hours as needed. (Patient not taking: No sig reported) 30 tablet 0     Musculoskeletal: Strength & Muscle Tone: within normal limits Gait & Station: normal Patient leans: N/A            Psychiatric Specialty Exam:  Presentation  General Appearance: Appropriate for Environment; Casual  Eye Contact:Good  Speech:Pressured  Speech Volume:Normal  Handedness:Right   Mood  and Affect  Mood:Euphoric  Affect:Congruent   Thought Process  Thought Processes:Disorganized  Duration of Psychotic Symptoms: Less than six months  Past Diagnosis of Schizophrenia or Psychoactive disorder: No  Descriptions of Associations:Intact  Orientation:Full (Time, Place and Person)  Thought Content:Scattered  Hallucinations:Hallucinations: None  Ideas of Reference:Delusions  Suicidal Thoughts:Suicidal Thoughts: No  Homicidal Thoughts:Homicidal Thoughts: No   Sensorium  Memory:Immediate Poor; Recent Poor; Remote Poor  Judgment:Impaired  Insight:Lacking   Executive Functions  Concentration:Fair  Attention Span:Fair  Recall:Poor  Fund of Knowledge:Poor  Language:Fair   Psychomotor Activity  Psychomotor Activity:Psychomotor Activity: Restlessness   Assets  Assets:Financial Resources/Insurance; Physical Health   Sleep  Sleep:Sleep: Fair Number of Hours of Sleep: 7    Physical Exam: Physical Exam Vitals and nursing note reviewed.  Constitutional:      Appearance: Normal appearance.  HENT:     Head: Normocephalic and atraumatic.     Right Ear: External ear normal.     Left Ear: External ear normal.     Nose: Nose normal.     Mouth/Throat:     Mouth: Mucous membranes are moist.     Pharynx: Oropharynx is clear.  Eyes:     Extraocular Movements: Extraocular movements intact.     Conjunctiva/sclera: Conjunctivae normal.     Pupils: Pupils are equal, round, and reactive to light.  Cardiovascular:     Rate and Rhythm: Normal rate.     Pulses: Normal pulses.  Pulmonary:     Effort: Pulmonary effort is normal.     Breath sounds: Normal breath sounds.  Abdominal:     General: Abdomen is flat.     Palpations: Abdomen is soft.  Musculoskeletal:        General: No swelling.  Normal range of motion.     Cervical back: Normal range of motion and neck supple.  Skin:    General: Skin is warm.     Comments: Small blisters to bilateral heels  without signs of infection, healing   Neurological:     General: No focal deficit present.     Mental Status: He is alert and oriented to person, place, and time.  Psychiatric:        Attention and Perception: He is inattentive.        Mood and Affect: Mood is elated.        Speech: Speech is rapid and pressured.        Behavior: Behavior is hyperactive.        Thought Content: Thought content is delusional.        Cognition and Memory: Cognition is impaired. Memory is impaired.        Judgment: Judgment is impulsive.   Review of Systems  Constitutional: Negative.   HENT: Negative.    Eyes: Negative.   Respiratory: Negative.    Cardiovascular: Negative.   Gastrointestinal: Negative.   Genitourinary: Negative.   Musculoskeletal:  Positive for back pain, joint pain and myalgias.  Skin: Negative.   Neurological: Negative.   Endo/Heme/Allergies: Negative.   Psychiatric/Behavioral:  Negative for hallucinations and suicidal ideas. The patient has insomnia.   Blood pressure 131/77, pulse 61, resp. rate 18, height 5\' 8"  (1.727 m), weight 54.4 kg, SpO2 100 %. Body mass index is 18.25 kg/m.  Treatment Plan Summary: Daily contact with patient to assess and evaluate symptoms and progress in treatment and Medication management 20 year old male presenting for acute psychosis. Differential diagnosis includes Bipolar I disorder, current episode manic with psychotic features, Brief psychotic disorder; or substance induced psychosis. Continue Olanzapine 10 mg BID. Patient appears to be improving from initial presentation to the emergency department. Lipid panel was WNL.   Observation Level/Precautions:  15 minute checks  Laboratory:   Completed  Psychotherapy:    Medications:    Consultations:    Discharge Concerns:    Estimated LOS:  Other:     Physician Treatment Plan for Primary Diagnosis: Acute psychosis (HCC) Long Term Goal(s): Improvement in symptoms so as ready for discharge  Short  Term Goals: Ability to identify changes in lifestyle to reduce recurrence of condition will improve, Ability to verbalize feelings will improve, Ability to disclose and discuss suicidal ideas, Ability to demonstrate self-control will improve, Ability to identify and develop effective coping behaviors will improve, Ability to maintain clinical measurements within normal limits will improve, Compliance with prescribed medications will improve, and Ability to identify triggers associated with substance abuse/mental health issues will improve  Physician Treatment Plan for Secondary Diagnosis: Principal Problem:   Acute psychosis (HCC) Active Problems:   Polysubstance abuse (HCC)  Long Term Goal(s): Improvement in symptoms so as ready for discharge  Short Term Goals: Ability to identify changes in lifestyle to reduce recurrence of condition will improve and Ability to identify triggers associated with substance abuse/mental health issues will improve  I certify that inpatient services furnished can reasonably be expected to improve the patient's condition.    21, MD 9/8/20229:59 AM

## 2021-01-19 NOTE — Progress Notes (Signed)
Patient slept most of shift, woke up around 1am requesting something to eat. Tray given to patient that was ordered early. Encouragement and support provided. Safety checks maintained. Medications given as prescribed. Pt receptive and remains safe on unit with q 15 min checks.

## 2021-01-20 MED ORDER — NICOTINE POLACRILEX 2 MG MT GUM
2.0000 mg | CHEWING_GUM | OROMUCOSAL | Status: DC | PRN
  Administered 2021-01-20 – 2021-02-01 (×35): 2 mg via ORAL
  Filled 2021-01-20 (×38): qty 1

## 2021-01-20 NOTE — Group Note (Addendum)
[facility] LCSW Group Therapy Note   Group Date: 01/20/2021 Start Time: 1300 End Time: 1400  Type of Therapy and Topic:  Group Therapy:  Feelings around Relapse and Recovery  Participation Level:  Active   Description of Group:    Patients in this group will discuss emotions they experience before and after a relapse. They will process how experiencing these feelings, or avoidance of experiencing them, relates to having a relapse. Facilitator will guide patients to explore emotions they have related to recovery. Patients will be encouraged to process which emotions are more powerful. They will be guided to discuss the emotional reaction significant others in their lives may have to patients' relapse or recovery. Patients will be assisted in exploring ways to respond to the emotions of others without this contributing to a relapse.  Therapeutic Goals: Patient will identify two or more emotions that lead to relapse for them:  Patient will identify two emotions that result when they relapse:  Patient will identify two emotions related to recovery:  Patient will demonstrate ability to communicate their needs through discussion and/or role plays.   Summary of Patient Progress: Patient was present for the entirety of the group session. Patient was an active listener and participated in the topic of discussion, provided helpful advice to others, and added nuance to topic of conversation at times. Patient presented with pressured speech and flight of ideas with loose associations. 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 Patient shared that he finds it difficult to focus his energy on one topic, stating, "I multi-task."     Therapeutic Modalities:   Cognitive Behavioral Therapy Solution-Focused Therapy Assertiveness Training Relapse Prevention Therapy   Corky Crafts, LCSWA

## 2021-01-20 NOTE — Plan of Care (Signed)
  Problem: Education: Goal: Knowledge of [facility] Education information/materials will improve 01/20/2021 1852 by Angeline Slim, RN Outcome: Progressing 01/20/2021 1847 by Angeline Slim, RN Outcome: Progressing Goal: Emotional status will improve 01/20/2021 1852 by Angeline Slim, RN Outcome: Progressing 01/20/2021 1847 by Angeline Slim, RN Outcome: Progressing Goal: Mental status will improve 01/20/2021 1852 by Angeline Slim, RN Outcome: Progressing 01/20/2021 1847 by Angeline Slim, RN Outcome: Progressing Goal: Verbalization of understanding the information provided will improve 01/20/2021 1852 by Angeline Slim, RN Outcome: Progressing 01/20/2021 1847 by Angeline Slim, RN Outcome: Progressing   Problem: Activity: Goal: Interest or engagement in activities will improve 01/20/2021 1852 by Angeline Slim, RN Outcome: Progressing 01/20/2021 1847 by Angeline Slim, RN Outcome: Progressing Goal: Sleeping patterns will improve 01/20/2021 1852 by Angeline Slim, RN Outcome: Progressing 01/20/2021 1847 by Angeline Slim, RN Outcome: Progressing

## 2021-01-20 NOTE — Progress Notes (Signed)
[facility] MD Progress Note  01/20/2021 11:47 AM Noah [name]  MRN:  [id number]  CC "I'm feeling better."  Subjective:   20 year old male who presented voluntarily for labile mood, agitation, and hallucinations. No acute events overnight, medication compliant, ADLs intact. This morning patient observed responding to internal stimuli and speaking to himself with no one around. He does feel that his thoughts are clear. He remains grandiose stating he will just go on to be a football or basketball player, or perhaps a wrestler. His goal is to "get it together, and make something of himself." Patient feels he is ready to discharge. He denies SI/HI/AH/VH. When I explain his symptoms of mania and need to remain in the hospital, he is agreeable. Denies any side effects to current medications.   Principal Problem: Acute psychosis (HCC) Diagnosis: Principal Problem:   Acute psychosis (HCC) Active Problems:   Polysubstance abuse (HCC)  Total Time spent with patient: 30 minutes  Past Psychiatric History: See H&P  Past Medical History: History reviewed. No pertinent past medical history. History reviewed. No pertinent surgical history. Family History: History reviewed. No pertinent family history. Family Psychiatric  History: See H&P Social History:  Social History   Substance and Sexual Activity  Alcohol Use No     Social History   Substance and Sexual Activity  Drug Use No    Social History   Socioeconomic History   Marital status: Single    Spouse name: Not on file   Number of children: Not on file   Years of education: Not on file   Highest education level: Not on file  Occupational History   Not on file  Tobacco Use   Smoking status: Some Days    Types: Cigarettes   Smokeless tobacco: Never  Vaping Use   Vaping Use: Every day  Substance and Sexual Activity   Alcohol use: No   Drug use: No   Sexual activity: Not on file  Other Topics Concern   Not on file  Social History  Narrative   Not on file   Social Determinants of Health   Financial Resource Strain: Not on file  Food Insecurity: Not on file  Transportation Needs: Not on file  Physical Activity: Not on file  Stress: Not on file  Social Connections: Not on file   Additional Social History:                         Sleep: Fair  Appetite:  Fair  Current Medications: Current Facility-Administered Medications  Medication Dose Route Frequency Provider Last Rate Last Admin   alum & mag hydroxide-simeth (MAALOX/MYLANTA) 200-200-20 MG/5ML suspension 30 mL  30 mL Oral Q4H PRN Zakari Sans, MD       feeding supplement (ENSURE ENLIVE / ENSURE PLUS) liquid 237 mL  237 mL Oral TID BM Johnanthony Sans, MD   237 mL at 01/20/21 1037   hydrOXYzine (ATARAX/VISTARIL) tablet 50 mg  50 mg Oral TID PRN Rodrigues Sans, MD   50 mg at 01/20/21 0730   ibuprofen (ADVIL) tablet 600 mg  600 mg Oral Q6H PRN Hamid Sans, MD   600 mg at 01/19/21 0929   magnesium hydroxide (MILK OF MAGNESIA) suspension 30 mL  30 mL Oral Daily PRN Jackson Sans, MD       multivitamin with minerals tablet 1 tablet  1 tablet Oral Daily Mar Sans, MD   1 tablet at 01/20/21 0730  OLANZapine (ZYPREXA) tablet 10 mg  10 mg Oral Q6H PRN Jacarie Sans, MD   10 mg at 01/20/21 1037   OLANZapine zydis (ZYPREXA) disintegrating tablet 10 mg  10 mg Oral BID Kevis Sans, MD   10 mg at 01/20/21 0730   traZODone (DESYREL) tablet 100 mg  100 mg Oral QHS PRN Clark Sans, MD   100 mg at 01/19/21 2205   ziprasidone (GEODON) injection 20 mg  20 mg Intramuscular Q6H PRN Axl Sans, MD        Lab Results:  Results for orders placed or performed during the hospital encounter of 01/18/21 (from the past 48 hour(s))  Lipid panel     Status: None   Collection Time: 01/19/21  6:38 AM  Result Value Ref Range   Cholesterol 155 0 - 200 mg/dL   Triglycerides 95 <536 mg/dL   HDL 58 >46 mg/dL   Total CHOL/HDL Ratio 2.7 RATIO    VLDL 19 0 - 40 mg/dL   LDL Cholesterol 78 0 - 99 mg/dL    Comment:        Total Cholesterol/HDL:CHD Risk Coronary Heart Disease Risk Table                     Men   Women  1/2 Average Risk   3.4   3.3  Average Risk       5.0   4.4  2 X Average Risk   9.6   7.1  3 X Average Risk  23.4   11.0        Use the calculated Patient Ratio above and the CHD Risk Table to determine the patient's CHD Risk.        ATP III CLASSIFICATION (LDL):  <100     mg/dL   Optimal  803-212  mg/dL   Near or Above                    Optimal  130-159  mg/dL   Borderline  248-250  mg/dL   High  >037     mg/dL   Very High Performed at [facility], [address] [place], [place] [address]   Hemoglobin A1c     Status: None   Collection Time: 01/19/21  6:38 AM  Result Value Ref Range   Hgb A1c MFr Bld 5.3 4.8 - 5.6 %    Comment: (NOTE) Pre diabetes:          5.7%-6.4%  Diabetes:              >6.4%  Glycemic control for   <7.0% adults with diabetes    Mean Plasma Glucose 105.41 mg/dL    Comment: Performed at [facility], [address] [place], [place] [address]  TSH     Status: Abnormal   Collection Time: 01/19/21  6:38 AM  Result Value Ref Range   TSH 4.528 (H) 0.350 - 4.500 uIU/mL    Comment: Performed by a 3rd Generation assay with a functional sensitivity of <=0.01 uIU/mL. Performed at [facility], [address] [place], [place] [address]     Blood Alcohol level:  Lab Results  Component Value Date   ETH 12 (H) 01/17/2021    Metabolic Disorder Labs: Lab Results  Component Value Date   HGBA1C 5.3 01/19/2021   MPG 105.41 01/19/2021   No results found for: PROLACTIN Lab Results  Component Value Date   CHOL 155  01/19/2021   TRIG 95 01/19/2021   HDL 58 01/19/2021   CHOLHDL 2.7 01/19/2021   VLDL 19 01/19/2021   LDLCALC 78 01/19/2021    Physical Findings: AIMS:  , ,  ,  ,    CIWA:    COWS:     Musculoskeletal: Strength & Muscle Tone: within  normal limits Gait & Station: normal Patient leans: N/A  Psychiatric Specialty Exam:  Presentation  General Appearance: Appropriate for Environment; Casual  Eye Contact:Good  Speech:Pressured  Speech Volume:Normal  Handedness:Right   Mood and Affect  Mood:Euphoric  Affect:Congruent   Thought Process  Thought Processes:Disorganized  Descriptions of Associations:Intact  Orientation:Full (Time, Place and Person)  Thought Content:Scattered  History of Schizophrenia/Schizoaffective disorder:No  Duration of Psychotic Symptoms:Less than six months  Hallucinations:Hallucinations: None  Ideas of Reference:Delusions  Suicidal Thoughts:Suicidal Thoughts: No  Homicidal Thoughts:Homicidal Thoughts: No   Sensorium  Memory:Immediate Poor; Recent Poor; Remote Poor  Judgment:Impaired  Insight:Lacking   Executive Functions  Concentration:Fair  Attention Span:Fair  Recall:Poor  Fund of Knowledge:Poor  Language:Fair   Psychomotor Activity  Psychomotor Activity:Psychomotor Activity: Restlessness   Assets  Assets:Financial Resources/Insurance; Physical Health   Sleep  Sleep:Sleep: Fair Number of Hours of Sleep: 7    Physical Exam: Physical Exam ROS Blood pressure 139/86, pulse 98, temperature 98 F (36.7 C), temperature source Oral, resp. rate 18, height 5\' 8"  (1.727 m), weight 54.4 kg, SpO2 100 %. Body mass index is 18.25 kg/m.   Treatment Plan Summary: Daily contact with patient to assess and evaluate symptoms and progress in treatment and Medication management 20 year old male presenting for acute psychosis. Differential diagnosis includes Bipolar I disorder, current episode manic with psychotic features, Brief psychotic disorder; or substance induced psychosis. Continue Olanzapine 10 mg BID. Patient appears to be improving from initial presentation to the emergency department. Lipid panel  and hemoglobin a1c WNL.   21,  MD 01/20/2021, 11:47 AM

## 2021-01-20 NOTE — Progress Notes (Signed)
   01/20/21 1400  Clinical Encounter Type  Visited With Patient  Visit Type Initial;Spiritual support;Social support  Referral From Nurse  Consult/Referral To Chaplain  Spiritual Encounters  Spiritual Needs Other (Comment) (general support)  Chaplain Burris greeted Pt and informed him about pastoral care services. Chaplain Burris provided a supportive presence and reflective listening. Pt appeared to be much better integrated onto the unit and presenting a different demeanor than what was described on previous day at time of referral. Chaplain offered general support and let Pt know that a chaplain would be available 24/7 if support maight be helpful to let nursing staff know of his need.

## 2021-01-20 NOTE — Plan of Care (Signed)
D: Patient awake and alert this am. Extremely impulsive and needs redirection. Denies SI/HI/AH/VH, anxiety, depression, pain. Patient given prn mediations throughout the shift for anxiety and agitation. Interacts with staff and peers, eats all meals and participates in groups.   A: Labs and vital signs monitored. Patient supported emotionally and encouraged to verbalize concerns.   R: All concerns addressed this shift. No s/s of distress no adverse reactions. Will continue to monitor for safety.

## 2021-01-20 NOTE — BH IP Treatment Plan (Signed)
Interdisciplinary Treatment and Diagnostic Plan Update  01/20/2021 Time of Session: 9:00AM Noah [name] MRN: [id number]  Principal Diagnosis: Acute psychosis [facility])  Secondary Diagnoses: Principal Problem:   Acute psychosis ([place]) Active Problems:   Polysubstance abuse ([place])   Current Medications:  Current Facility-Administered Medications  Medication Dose Route Frequency Provider Last Rate Last Admin   alum & mag hydroxide-simeth (MAALOX/MYLANTA) 200-200-20 MG/5ML suspension 30 mL  30 mL Oral Q4H PRN Salley Scarlet, MD       feeding supplement (ENSURE ENLIVE / ENSURE PLUS) liquid 237 mL  237 mL Oral TID BM Salley Scarlet, MD   237 mL at 01/19/21 2125   hydrOXYzine (ATARAX/VISTARIL) tablet 50 mg  50 mg Oral TID PRN Salley Scarlet, MD   50 mg at 01/20/21 0730   ibuprofen (ADVIL) tablet 600 mg  600 mg Oral Q6H PRN Salley Scarlet, MD   600 mg at 01/19/21 0109   magnesium hydroxide (MILK OF MAGNESIA) suspension 30 mL  30 mL Oral Daily PRN Salley Scarlet, MD       multivitamin with minerals tablet 1 tablet  1 tablet Oral Daily Salley Scarlet, MD   1 tablet at 01/20/21 0730   OLANZapine (ZYPREXA) tablet 10 mg  10 mg Oral Q6H PRN Salley Scarlet, MD   10 mg at 01/19/21 1111   OLANZapine zydis (ZYPREXA) disintegrating tablet 10 mg  10 mg Oral BID Salley Scarlet, MD   10 mg at 01/20/21 0730   traZODone (DESYREL) tablet 100 mg  100 mg Oral QHS PRN Salley Scarlet, MD   100 mg at 01/19/21 2205   ziprasidone (GEODON) injection 20 mg  20 mg Intramuscular Q6H PRN Salley Scarlet, MD       PTA Medications: Medications Prior to Admission  Medication Sig Dispense Refill Last Dose   ibuprofen (ADVIL,MOTRIN) 600 MG tablet Take 1 tablet (600 mg total) by mouth every 6 (six) hours as needed. (Patient not taking: No sig reported) 30 tablet 0     Patient Stressors: Medication change or noncompliance   Substance abuse    Patient Strengths: Ability for insight  Communication skills   Physical Health  Supportive family/friends   Treatment Modalities: Medication Management, Group therapy, Case management,  1 to 1 session with clinician, Psychoeducation, Recreational therapy.   Physician Treatment Plan for Primary Diagnosis: Acute psychosis ([place]) Long Term Goal(s): Improvement in symptoms so as ready for discharge   Short Term Goals: Ability to identify changes in lifestyle to reduce recurrence of condition will improve Ability to identify triggers associated with substance abuse/mental health issues will improve Ability to verbalize feelings will improve Ability to disclose and discuss suicidal ideas Ability to demonstrate self-control will improve Ability to identify and develop effective coping behaviors will improve Ability to maintain clinical measurements within normal limits will improve Compliance with prescribed medications will improve  Medication Management: Evaluate patient's response, side effects, and tolerance of medication regimen.  Therapeutic Interventions: 1 to 1 sessions, Unit Group sessions and Medication administration.  Evaluation of Outcomes: Not Met  Physician Treatment Plan for Secondary Diagnosis: Principal Problem:   Acute psychosis ([place]) Active Problems:   Polysubstance abuse ([place])  Long Term Goal(s): Improvement in symptoms so as ready for discharge   Short Term Goals: Ability to identify changes in lifestyle to reduce recurrence of condition will improve Ability to identify triggers associated with substance abuse/mental health issues will improve Ability to verbalize feelings will improve Ability to  disclose and discuss suicidal ideas Ability to demonstrate self-control will improve Ability to identify and develop effective coping behaviors will improve Ability to maintain clinical measurements within normal limits will improve Compliance with prescribed medications will improve     Medication Management: Evaluate patient's  response, side effects, and tolerance of medication regimen.  Therapeutic Interventions: 1 to 1 sessions, Unit Group sessions and Medication administration.  Evaluation of Outcomes: Not Met   RN Treatment Plan for Primary Diagnosis: Acute psychosis ([place]) Long Term Goal(s): Knowledge of disease and therapeutic regimen to maintain health will improve  Short Term Goals: Ability to remain free from injury will improve, Ability to verbalize frustration and anger appropriately will improve, Ability to demonstrate self-control, Ability to participate in decision making will improve, Ability to verbalize feelings will improve, Ability to identify and develop effective coping behaviors will improve, and Compliance with prescribed medications will improve  Medication Management: RN will administer medications as ordered by provider, will assess and evaluate patient's response and provide education to patient for prescribed medication. RN will report any adverse and/or side effects to prescribing provider.  Therapeutic Interventions: 1 on 1 counseling sessions, Psychoeducation, Medication administration, Evaluate responses to treatment, Monitor vital signs and CBGs as ordered, Perform/monitor CIWA, COWS, AIMS and Fall Risk screenings as ordered, Perform wound care treatments as ordered.  Evaluation of Outcomes: Not Met   LCSW Treatment Plan for Primary Diagnosis: Acute psychosis ([place]) Long Term Goal(s): Safe transition to appropriate next level of care at discharge, Engage patient in therapeutic group addressing interpersonal concerns.  Short Term Goals: Engage patient in aftercare planning with referrals and resources, Increase social support, Facilitate acceptance of mental health diagnosis and concerns, Identify triggers associated with mental health/substance abuse issues, and Increase skills for wellness and recovery  Therapeutic Interventions: Assess for all discharge needs, 1 to 1 time with  Social worker, Explore available resources and support systems, Assess for adequacy in community support network, Educate family and significant other(s) on suicide prevention, Complete Psychosocial Assessment, Interpersonal group therapy.  Evaluation of Outcomes: Not Met   Progress in Treatment: Attending groups: No. Participating in groups: No. Taking medication as prescribed: Yes. Toleration medication: Yes. Family/Significant other contact made: No, will contact:  when given permission. Patient understands diagnosis: Yes. Discussing patient identified problems/goals with staff: Yes. Medical problems stabilized or resolved: Yes. Denies suicidal/homicidal ideation: Yes. Issues/concerns per patient self-inventory: No. Other: None.  New problem(s) identified: No, Describe:  none.  New Short Term/Long Term Goal(s): detox, elimination of symptoms of psychosis, medication management for mood stabilization; elimination of SI thoughts; development of comprehensive mental wellness/sobriety plan.  Patient Goals: "Fix myself a bit."   Discharge Plan or Barriers: CSW will assist pt with development of an appropriate aftercare/discharge plan.  Reason for Continuation of Hospitalization: Aggression Mania Medication stabilization  Estimated Length of Stay: 1-7 days   Scribe for Treatment Team: Shirl Harris, LCSW 01/20/2021 9:33 AM

## 2021-01-20 NOTE — Plan of Care (Signed)
Pt noted to be wandering unit at times looking into other patient's rooms. Pt ambulatory in room and hallway. Pt also noted in  the dayroom interacting with peers. Pt pleasant and cooperative. Pt denies SI/HI/AVH.Pt has pressured and rapid speech, which is logical/coherent. Q 15 min safety checks maintained.  Problem: Education: Goal: Knowledge of [facility] Education information/materials will improve Outcome: Progressing Goal: Emotional status will improve Outcome: Progressing Goal: Mental status will improve Outcome: Progressing Goal: Verbalization of understanding the information provided will improve Outcome: Progressing   Problem: Activity: Goal: Interest or engagement in activities will improve Outcome: Progressing Goal: Sleeping patterns will improve Outcome: Progressing   Problem: Coping: Goal: Ability to verbalize frustrations and anger appropriately will improve Outcome: Progressing Goal: Ability to demonstrate self-control will improve Outcome: Progressing   Problem: Health Behavior/Discharge Planning: Goal: Identification of resources available to assist in meeting health care needs will improve Outcome: Progressing Goal: Compliance with treatment plan for underlying cause of condition will improve Outcome: Progressing   Problem: Physical Regulation: Goal: Ability to maintain clinical measurements within normal limits will improve Outcome: Progressing   Problem: Safety: Goal: Periods of time without injury will increase Outcome: Progressing   Problem: Activity: Goal: Will verbalize the importance of balancing activity with adequate rest periods Outcome: Progressing   Problem: Education: Goal: Will be free of psychotic symptoms Outcome: Progressing Goal: Knowledge of the prescribed therapeutic regimen will improve Outcome: Progressing   Problem: Coping: Goal: Coping ability will improve Outcome: Progressing Goal: Will verbalize feelings Outcome:  Progressing   Problem: Health Behavior/Discharge Planning: Goal: Compliance with prescribed medication regimen will improve Outcome: Progressing   Problem: Nutritional: Goal: Ability to achieve adequate nutritional intake will improve Outcome: Progressing   Problem: Role Relationship: Goal: Ability to communicate needs accurately will improve Outcome: Progressing Goal: Ability to interact with others will improve Outcome: Progressing   Problem: Safety: Goal: Ability to redirect hostility and anger into socially appropriate behaviors will improve Outcome: Progressing Goal: Ability to remain free from injury will improve Outcome: Progressing   Problem: Self-Care: Goal: Ability to participate in self-care as condition permits will improve Outcome: Progressing   Problem: Self-Concept: Goal: Will verbalize positive feelings about self Outcome: Progressing   Problem: Education: Goal: Knowledge of disease or condition will improve Outcome: Progressing Goal: Understanding of discharge needs will improve Outcome: Progressing   Problem: Health Behavior/Discharge Planning: Goal: Ability to identify changes in lifestyle to reduce recurrence of condition will improve Outcome: Progressing Goal: Identification of resources available to assist in meeting health care needs will improve Outcome: Progressing   Problem: Physical Regulation: Goal: Complications related to the disease process, condition or treatment will be avoided or minimized Outcome: Progressing   Problem: Safety: Goal: Ability to remain free from injury will improve Outcome: Progressing

## 2021-01-21 MED ORDER — OLANZAPINE 10 MG PO TBDP
20.0000 mg | ORAL_TABLET | Freq: Every day | ORAL | Status: DC
  Administered 2021-01-21 – 2021-01-31 (×11): 20 mg via ORAL
  Filled 2021-01-21: qty 2
  Filled 2021-01-21 (×6): qty 4
  Filled 2021-01-21: qty 2
  Filled 2021-01-21: qty 4
  Filled 2021-01-21: qty 2
  Filled 2021-01-21 (×2): qty 4
  Filled 2021-01-21: qty 2
  Filled 2021-01-21: qty 4
  Filled 2021-01-21 (×2): qty 2

## 2021-01-21 MED ORDER — OLANZAPINE 5 MG PO TBDP: 15.0000 mg | ORAL_TABLET | Freq: Every day | ORAL | Status: DC

## 2021-01-21 MED ORDER — OLANZAPINE 5 MG PO TBDP
10.0000 mg | ORAL_TABLET | Freq: Every day | ORAL | Status: DC
  Administered 2021-01-22 – 2021-01-28 (×7): 10 mg via ORAL
  Filled 2021-01-21 (×7): qty 2

## 2021-01-21 MED ORDER — OLANZAPINE 5 MG PO TBDP: 20.0000 mg | ORAL_TABLET | Freq: Every day | ORAL | Status: DC

## 2021-01-21 NOTE — Progress Notes (Signed)
Pt has been visible on the unit frequently interacting with peers and staff. He denies SI/HI or AV hallucinations. 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 He stated, "I am independent , it's on my taxes, I can sign myself out of here and I don't have to live with family. He states his grandmother is controlling, because she is always worried about his. He stated he ran from the cops, because they said freeze and not stop. He states he is homeless and uses "pot" His speech was pressured and some of his thoughts did not make any sense.

## 2021-01-21 NOTE — Plan of Care (Signed)
  Problem: Education: Goal: Knowledge of [facility] Education information/materials will improve Outcome: Progressing Goal: Emotional status will improve Outcome: Progressing Goal: Mental status will improve Outcome: Progressing Goal: Verbalization of understanding the information provided will improve Outcome: Progressing   Problem: Activity: Goal: Interest or engagement in activities will improve Outcome: Progressing Goal: Sleeping patterns will improve Outcome: Progressing   Problem: Coping: Goal: Ability to verbalize frustrations and anger appropriately will improve Outcome: Progressing Goal: Ability to demonstrate self-control will improve Outcome: Progressing   Problem: Health Behavior/Discharge Planning: Goal: Identification of resources available to assist in meeting health care needs will improve Outcome: Progressing Goal: Compliance with treatment plan for underlying cause of condition will improve Outcome: Progressing   Problem: Physical Regulation: Goal: Ability to maintain clinical measurements within normal limits will improve Outcome: Progressing   Problem: Safety: Goal: Periods of time without injury will increase Outcome: Progressing   Problem: Activity: Goal: Will verbalize the importance of balancing activity with adequate rest periods Outcome: Progressing   Problem: Education: Goal: Will be free of psychotic symptoms Outcome: Progressing Goal: Knowledge of the prescribed therapeutic regimen will improve Outcome: Progressing   Problem: Coping: Goal: Coping ability will improve Outcome: Progressing Goal: Will verbalize feelings Outcome: Progressing   Problem: Health Behavior/Discharge Planning: Goal: Compliance with prescribed medication regimen will improve Outcome: Progressing   Problem: Nutritional: Goal: Ability to achieve adequate nutritional intake will improve Outcome: Progressing   Problem: Role Relationship: Goal:  Ability to communicate needs accurately will improve Outcome: Progressing Goal: Ability to interact with others will improve Outcome: Progressing   Problem: Safety: Goal: Ability to redirect hostility and anger into socially appropriate behaviors will improve Outcome: Progressing Goal: Ability to remain free from injury will improve Outcome: Progressing   Problem: Self-Care: Goal: Ability to participate in self-care as condition permits will improve Outcome: Progressing   Problem: Self-Concept: Goal: Will verbalize positive feelings about self Outcome: Progressing   Problem: Education: Goal: Knowledge of disease or condition will improve Outcome: Progressing Goal: Understanding of discharge needs will improve Outcome: Progressing   Problem: Health Behavior/Discharge Planning: Goal: Ability to identify changes in lifestyle to reduce recurrence of condition will improve Outcome: Progressing Goal: Identification of resources available to assist in meeting health care needs will improve Outcome: Progressing   Problem: Physical Regulation: Goal: Complications related to the disease process, condition or treatment will be avoided or minimized Outcome: Progressing   Problem: Safety: Goal: Ability to remain free from injury will improve Outcome: Progressing   

## 2021-01-21 NOTE — Plan of Care (Signed)
  Problem: Education: Goal: Knowledge of [facility] Education information/materials will improve Outcome: Progressing Goal: Emotional status will improve Outcome: Progressing Goal: Mental status will improve Outcome: Progressing Goal: Verbalization of understanding the information provided will improve Outcome: Progressing   Problem: Activity: Goal: Interest or engagement in activities will improve Outcome: Progressing Goal: Sleeping patterns will improve Outcome: Progressing   Problem: Coping: Goal: Ability to verbalize frustrations and anger appropriately will improve Outcome: Progressing Goal: Ability to demonstrate self-control will improve Outcome: Progressing   Problem: Health Behavior/Discharge Planning: Goal: Identification of resources available to assist in meeting health care needs will improve Outcome: Progressing Goal: Compliance with treatment plan for underlying cause of condition will improve Outcome: Progressing   Problem: Physical Regulation: Goal: Ability to maintain clinical measurements within normal limits will improve Outcome: Progressing   Problem: Safety: Goal: Periods of time without injury will increase Outcome: Progressing   Problem: Activity: Goal: Will verbalize the importance of balancing activity with adequate rest periods Outcome: Progressing   Problem: Education: Goal: Will be free of psychotic symptoms Outcome: Progressing Goal: Knowledge of the prescribed therapeutic regimen will improve Outcome: Progressing   Problem: Coping: Goal: Coping ability will improve Outcome: Progressing Goal: Will verbalize feelings Outcome: Progressing   Problem: Health Behavior/Discharge Planning: Goal: Compliance with prescribed medication regimen will improve Outcome: Progressing   Problem: Nutritional: Goal: Ability to achieve adequate nutritional intake will improve Outcome: Progressing   Problem: Role Relationship: Goal:  Ability to communicate needs accurately will improve Outcome: Progressing Goal: Ability to interact with others will improve Outcome: Progressing   Problem: Safety: Goal: Ability to redirect hostility and anger into socially appropriate behaviors will improve Outcome: Progressing Goal: Ability to remain free from injury will improve Outcome: Progressing   Problem: Self-Care: Goal: Ability to participate in self-care as condition permits will improve Outcome: Progressing   Problem: Self-Concept: Goal: Will verbalize positive feelings about self Outcome: Progressing   Problem: Education: Goal: Knowledge of disease or condition will improve Outcome: Progressing Goal: Understanding of discharge needs will improve Outcome: Progressing   Problem: Health Behavior/Discharge Planning: Goal: Ability to identify changes in lifestyle to reduce recurrence of condition will improve Outcome: Progressing Goal: Identification of resources available to assist in meeting health care needs will improve Outcome: Progressing   Problem: Physical Regulation: Goal: Complications related to the disease process, condition or treatment will be avoided or minimized Outcome: Progressing   Problem: Safety: Goal: Ability to remain free from injury will improve Outcome: Progressing   

## 2021-01-21 NOTE — Progress Notes (Signed)
Patient has been pleasant and cooperative, but silly and hyperactive. Denies SI, HI and AVH but acting bizarre at times, dancing around the unit. Socializing with same age peer.

## 2021-01-21 NOTE — Group Note (Signed)
LCSW Group Therapy Note  Group Date: 01/21/2021 Start Time: 1300 End Time: 1400   Type of Therapy and Topic:  Group Therapy - How To Cope with Nervousness about Discharge   Participation Level:  Active   Description of Group This process group involved identification of patients' feelings about discharge. Some of them are scheduled to be discharged soon, while others are new admissions, but each of them was asked to share thoughts and feelings surrounding discharge from the hospital. One common theme was that they are excited at the prospect of going home, while another was that many of them are apprehensive about sharing why they were hospitalized. Patients were given the opportunity to discuss these feelings with their peers in preparation for discharge.  Therapeutic Goals  Patient will identify their overall feelings about pending discharge. Patient will think about how they might proactively address issues that they believe will once again arise once they get home (i.e. with parents). Patients will participate in discussion about having hope for change.   Summary of Patient Progress:  Noah [name] was very active throughout the session. Pt demonstrated little insight into the subject matter, and proved open to input from peers and feedback from CSW. Pt was respectful of peers and participated throughout the entire session. Patient lacks insight toward his mental health condition. Believes he is ready for discharge despite pressured speech, flight of ideas, and general mania. Other in the group attempted to provide feedback, though patient was unable to accept advice.    Therapeutic Modalities Cognitive Behavioral Therapy   Almedia Balls 01/21/2021  4:08 PM

## 2021-01-21 NOTE — Progress Notes (Signed)
[facility] MD Progress Note  01/21/2021 10:10 AM Noah [name]  MRN:  [id number]  CC "I feel like I can leave today."  Subjective:   20 year old male who presented voluntarily for labile mood, agitation, and hallucinations. No acute events overnight, medication compliant, ADLs intact. This morning patient remains euphoric on exam with pressured speech. However, his thoughts are becoming more organized and easier to follow. He is fixated on discharging today to attend [employer] opening night. 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 He gives me examples of his voices he uses to play the joker, Nelva Nay, and [employer]. He then talks about wanting to reconcile with his sisters, and be a "good boy." Conversation from here becomes more difficult to follow. He is still calm when told he was not safe for discharge today.   Principal Problem: Acute psychosis (HCC) Diagnosis: Principal Problem:   Acute psychosis (HCC) Active Problems:   Polysubstance abuse (HCC)  Total Time spent with patient: 30 minutes  Past Psychiatric History: See H&P  Past Medical History: History reviewed. No pertinent past medical history. History reviewed. No pertinent surgical history. Family History: History reviewed. No pertinent family history. Family Psychiatric  History: See H&P Social History:  Social History   Substance and Sexual Activity  Alcohol Use No     Social History   Substance and Sexual Activity  Drug Use No    Social History   Socioeconomic History   Marital status: Single    Spouse name: Not on file   Number of children: Not on file   Years of education: Not on file   Highest education level: Not on file  Occupational History   Not on file  Tobacco Use   Smoking status: Some Days    Types: Cigarettes   Smokeless tobacco: Never  Vaping Use   Vaping Use: Every day  Substance and Sexual Activity   Alcohol use: No   Drug use: No   Sexual activity: Not on  file  Other Topics Concern   Not on file  Social History Narrative   Not on file   Social Determinants of Health   Financial Resource Strain: Not on file  Food Insecurity: Not on file  Transportation Needs: Not on file  Physical Activity: Not on file  Stress: Not on file  Social Connections: Not on file   Additional Social History:                         Sleep: Fair  Appetite:  Fair  Current Medications: Current Facility-Administered Medications  Medication Dose Route Frequency Provider Last Rate Last Admin   alum & mag hydroxide-simeth (MAALOX/MYLANTA) 200-200-20 MG/5ML suspension 30 mL  30 mL Oral Q4H PRN Rastus Sans, MD       feeding supplement (ENSURE ENLIVE / ENSURE PLUS) liquid 237 mL  237 mL Oral TID BM Chrishon Sans, MD   237 mL at 01/20/21 2024   hydrOXYzine (ATARAX/VISTARIL) tablet 50 mg  50 mg Oral TID PRN Romaine Sans, MD   50 mg at 01/20/21 2010   ibuprofen (ADVIL) tablet 600 mg  600 mg Oral Q6H PRN Darrek Sans, MD   600 mg at 01/19/21 7829   magnesium hydroxide (MILK OF MAGNESIA) suspension 30 mL  30 mL Oral Daily PRN Missael Sans, MD       multivitamin with minerals tablet 1 tablet  1 tablet Oral  Daily Abron Sans, MD   1 tablet at 01/21/21 1696   nicotine polacrilex (NICORETTE) gum 2 mg  2 mg Oral Q4H PRN Aidan Sans, MD   2 mg at 01/20/21 1540   OLANZapine (ZYPREXA) tablet 10 mg  10 mg Oral Q6H PRN Abdirizak Sans, MD   10 mg at 01/20/21 1037   [START ON 01/22/2021] OLANZapine zydis (ZYPREXA) disintegrating tablet 10 mg  10 mg Oral Daily Creg Sans, MD       OLANZapine zydis (ZYPREXA) disintegrating tablet 15 mg  15 mg Oral QHS Khallid Sans, MD       traZODone (DESYREL) tablet 100 mg  100 mg Oral QHS PRN Cora Sans, MD   100 mg at 01/20/21 2115   ziprasidone (GEODON) injection 20 mg  20 mg Intramuscular Q6H PRN Tyreck Sans, MD        Lab Results:  No results found for this or any previous visit  (from the past 48 hour(s)).   Blood Alcohol level:  Lab Results  Component Value Date   ETH 12 (H) 01/17/2021    Metabolic Disorder Labs: Lab Results  Component Value Date   HGBA1C 5.3 01/19/2021   MPG 105.41 01/19/2021   No results found for: PROLACTIN Lab Results  Component Value Date   CHOL 155 01/19/2021   TRIG 95 01/19/2021   HDL 58 01/19/2021   CHOLHDL 2.7 01/19/2021   VLDL 19 01/19/2021   LDLCALC 78 01/19/2021    Physical Findings: AIMS:  , ,  ,  ,    CIWA:    COWS:     Musculoskeletal: Strength & Muscle Tone: within normal limits Gait & Station: normal Patient leans: N/A  Psychiatric Specialty Exam:  Presentation  General Appearance: Appropriate for Environment; Casual  Eye Contact:Good  Speech:Pressured  Speech Volume:Normal  Handedness:Right   Mood and Affect  Mood:Euphoric  Affect:Congruent   Thought Process  Thought Processes:More linear Descriptions of Associations:Intact  Orientation:Full (Time, Place and Person)  Thought Content:Less scattered History of Schizophrenia/Schizoaffective disorder:No  Duration of Psychotic Symptoms:Less than six months  Hallucinations:Denies, observed responding to internal stimuli  Ideas of Reference:Delusions  Suicidal Thoughts:Denies  Homicidal Thoughts:Denies   Sensorium  Memory:Immediate Poor; Recent Poor; Remote Poor  Judgment:Impaired  Insight:Lacking   Executive Functions  Concentration:Fair  Attention Span:Fair  Recall:Fair Fund of Knowledge:Fair Language:Fair   Psychomotor Activity  Psychomotor Activity:Restless   Assets  Assets:Financial Resources/Insurance; Physical Health   Sleep  Sleep:Fair, 7.75    Physical Exam: Physical Exam ROS Blood pressure 127/77, pulse 68, temperature 98 F (36.7 C), temperature source Oral, resp. rate 18, height 5\' 8"  (1.727 m), weight 54.4 kg, SpO2 100 %. Body mass index is 18.25 kg/m.   Treatment Plan Summary: Daily  contact with patient to assess and evaluate symptoms and progress in treatment and Medication management 20 year old male presenting for acute psychosis. Differential diagnosis includes Bipolar I disorder, current episode manic with psychotic features, Brief psychotic disorder; or substance induced psychosis. 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  Increase Olanzapine 10 mg in the morning and 15 mg in the evening.  Patient appears to be improving from initial presentation to the emergency department. Lipid panel  and hemoglobin a1c WNL.   21, MD 01/21/2021, 10:10 AM

## 2021-01-22 MED ORDER — DIVALPROEX SODIUM 500 MG PO DR TAB
500.0000 mg | DELAYED_RELEASE_TABLET | Freq: Two times a day (BID) | ORAL | Status: DC
  Administered 2021-01-22 – 2021-01-26 (×8): 500 mg via ORAL
  Filled 2021-01-22 (×8): qty 1

## 2021-01-22 NOTE — Progress Notes (Signed)
Awake, alert, and oriented x 4. Denies SI/HI/AVH. Reports anxiety; (see MAR). Pleasant and hyperactive at this time; no aggressive behaviors. Remains safe on the unit with q15 min safety checks.

## 2021-01-22 NOTE — Progress Notes (Signed)
[facility] MD Progress Note  01/22/2021 11:04 AM Noah [name]  MRN:  [id number]  CC "I'm good."  Subjective:   20 year old male who presented voluntarily for labile mood, agitation, and hallucinations. No acute events overnight, medication compliant, ADLs intact. This morning patient remains euphoric on exam with pressured speech and flight of ideas. Although, continues to be slightly more linear each day. He is sleeping well overnight, and interacting with peers. Denies SI/HI/AH/VH. Continues to have poor insight into mental illness.   Principal Problem: Acute psychosis (HCC) Diagnosis: Principal Problem:   Acute psychosis (HCC) Active Problems:   Polysubstance abuse (HCC)  Total Time spent with patient: 30 minutes  Past Psychiatric History: See H&P  Past Medical History: History reviewed. No pertinent past medical history. History reviewed. No pertinent surgical history. Family History: History reviewed. No pertinent family history. Family Psychiatric  History: See H&P Social History:  Social History   Substance and Sexual Activity  Alcohol Use No     Social History   Substance and Sexual Activity  Drug Use No    Social History   Socioeconomic History   Marital status: Single    Spouse name: Not on file   Number of children: Not on file   Years of education: Not on file   Highest education level: Not on file  Occupational History   Not on file  Tobacco Use   Smoking status: Some Days    Types: Cigarettes   Smokeless tobacco: Never  Vaping Use   Vaping Use: Every day  Substance and Sexual Activity   Alcohol use: No   Drug use: No   Sexual activity: Not on file  Other Topics Concern   Not on file  Social History Narrative   Not on file   Social Determinants of Health   Financial Resource Strain: Not on file  Food Insecurity: Not on file  Transportation Needs: Not on file  Physical Activity: Not on file  Stress: Not on file  Social Connections: Not on file    Additional Social History:                         Sleep: Fair  Appetite:  Fair  Current Medications: Current Facility-Administered Medications  Medication Dose Route Frequency Provider Last Rate Last Admin   alum & mag hydroxide-simeth (MAALOX/MYLANTA) 200-200-20 MG/5ML suspension 30 mL  30 mL Oral Q4H PRN Aryan Sans, MD       feeding supplement (ENSURE ENLIVE / ENSURE PLUS) liquid 237 mL  237 mL Oral TID BM Paulette Sans, MD   237 mL at 01/22/21 1015   hydrOXYzine (ATARAX/VISTARIL) tablet 50 mg  50 mg Oral TID PRN Beaumont Sans, MD   50 mg at 01/21/21 1951   ibuprofen (ADVIL) tablet 600 mg  600 mg Oral Q6H PRN Sanjay Sans, MD   600 mg at 01/19/21 0929   magnesium hydroxide (MILK OF MAGNESIA) suspension 30 mL  30 mL Oral Daily PRN Onnie Sans, MD       multivitamin with minerals tablet 1 tablet  1 tablet Oral Daily Deagan Sans, MD   1 tablet at 01/22/21 0757   nicotine polacrilex (NICORETTE) gum 2 mg  2 mg Oral Q4H PRN Justan Sans, MD   2 mg at 01/22/21 0554   OLANZapine (ZYPREXA) tablet 10 mg  10 mg Oral Q6H PRN Rannie Sans, MD   10 mg at 01/21/21 1951  OLANZapine zydis (ZYPREXA) disintegrating tablet 10 mg  10 mg Oral Daily Renell Sans, MD   10 mg at 01/22/21 0757   OLANZapine zydis (ZYPREXA) disintegrating tablet 20 mg  20 mg Oral QHS Alexis Sans, MD   20 mg at 01/21/21 2104   traZODone (DESYREL) tablet 100 mg  100 mg Oral QHS PRN Jayln Sans, MD   100 mg at 01/21/21 2146   ziprasidone (GEODON) injection 20 mg  20 mg Intramuscular Q6H PRN Vergil Sans, MD        Lab Results:  No results found for this or any previous visit (from the past 48 hour(s)).   Blood Alcohol level:  Lab Results  Component Value Date   ETH 12 (H) 01/17/2021    Metabolic Disorder Labs: Lab Results  Component Value Date   HGBA1C 5.3 01/19/2021   MPG 105.41 01/19/2021   No results found for: PROLACTIN Lab Results  Component Value  Date   CHOL 155 01/19/2021   TRIG 95 01/19/2021   HDL 58 01/19/2021   CHOLHDL 2.7 01/19/2021   VLDL 19 01/19/2021   LDLCALC 78 01/19/2021    Physical Findings: AIMS:  , ,  ,  ,    CIWA:    COWS:     Musculoskeletal: Strength & Muscle Tone: within normal limits Gait & Station: normal Patient leans: N/A  Psychiatric Specialty Exam:  Presentation  General Appearance: Appropriate for Environment; Casual  Eye Contact:Good  Speech:Pressured  Speech Volume:Normal  Handedness:Right   Mood and Affect  Mood:Euphoric  Affect:Congruent   Thought Process  Thought Processes:More linear Descriptions of Associations:Intact  Orientation:Full (Time, Place and Person)  Thought Content:Less scattered History of Schizophrenia/Schizoaffective disorder:No  Duration of Psychotic Symptoms:Less than six months  Hallucinations:Denies, observed responding to internal stimuli  Ideas of Reference:Delusions  Suicidal Thoughts:Denies  Homicidal Thoughts:Denies   Sensorium  Memory:Immediate Poor; Recent Poor; Remote Poor  Judgment:Impaired  Insight:Lacking   Executive Functions  Concentration:Fair  Attention Span:Fair  Recall:Fair Fund of Knowledge:Fair Language:Fair   Psychomotor Activity  Psychomotor Activity:Restless   Assets  Assets:Financial Resources/Insurance; Physical Health   Sleep  Sleep:Fair, 7.75    Physical Exam: Physical Exam ROS Blood pressure 134/77, pulse 85, temperature 97.8 F (36.6 C), temperature source Oral, resp. rate 18, height 5\' 8"  (1.727 m), weight 54.4 kg, SpO2 99 %. Body mass index is 18.25 kg/m.   Treatment Plan Summary: Daily contact with patient to assess and evaluate symptoms and progress in treatment and Medication management 20 year old male presenting for acute psychosis. Differential diagnosis includes Bipolar I disorder, current episode manic with psychotic features, Brief psychotic disorder; or substance induced  psychosis. 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  Continue Olanzapine 10 mg in the morning and 20 mg in the evening.  Patient appears to be improving from initial presentation to the emergency department. Lipid panel  and hemoglobin a1c WNL.   01/22/21: Psychiatric exam above reviewed and remains accurate. Assessment and plan above reviewed and updated.    03/24/21, MD 01/22/2021, 11:04 AM

## 2021-01-22 NOTE — Progress Notes (Signed)
Patient reports feeling agitated and request prn. Writer notes rapid, pressured speech, agitation and and anxiety. Prn given.

## 2021-01-22 NOTE — Plan of Care (Signed)
  Problem: Education: Goal: Knowledge of [facility] Education information/materials will improve Outcome: Progressing Goal: Emotional status will improve Outcome: Progressing Goal: Mental status will improve Outcome: Progressing Goal: Verbalization of understanding the information provided will improve Outcome: Progressing   Problem: Activity: Goal: Interest or engagement in activities will improve Outcome: Progressing Goal: Sleeping patterns will improve Outcome: Progressing   Problem: Coping: Goal: Ability to verbalize frustrations and anger appropriately will improve Outcome: Progressing Goal: Ability to demonstrate self-control will improve Outcome: Progressing   Problem: Health Behavior/Discharge Planning: Goal: Identification of resources available to assist in meeting health care needs will improve Outcome: Progressing Goal: Compliance with treatment plan for underlying cause of condition will improve Outcome: Progressing   Problem: Physical Regulation: Goal: Ability to maintain clinical measurements within normal limits will improve Outcome: Progressing   Problem: Safety: Goal: Periods of time without injury will increase Outcome: Progressing   Problem: Activity: Goal: Will verbalize the importance of balancing activity with adequate rest periods Outcome: Progressing   Problem: Education: Goal: Will be free of psychotic symptoms Outcome: Progressing Goal: Knowledge of the prescribed therapeutic regimen will improve Outcome: Progressing   Problem: Coping: Goal: Coping ability will improve Outcome: Progressing Goal: Will verbalize feelings Outcome: Progressing   Problem: Health Behavior/Discharge Planning: Goal: Compliance with prescribed medication regimen will improve Outcome: Progressing   Problem: Nutritional: Goal: Ability to achieve adequate nutritional intake will improve Outcome: Progressing   Problem: Role Relationship: Goal:  Ability to communicate needs accurately will improve Outcome: Progressing Goal: Ability to interact with others will improve Outcome: Progressing   Problem: Safety: Goal: Ability to redirect hostility and anger into socially appropriate behaviors will improve Outcome: Progressing Goal: Ability to remain free from injury will improve Outcome: Progressing   Problem: Self-Care: Goal: Ability to participate in self-care as condition permits will improve Outcome: Progressing   Problem: Self-Concept: Goal: Will verbalize positive feelings about self Outcome: Progressing   Problem: Education: Goal: Knowledge of disease or condition will improve Outcome: Progressing Goal: Understanding of discharge needs will improve Outcome: Progressing   Problem: Health Behavior/Discharge Planning: Goal: Ability to identify changes in lifestyle to reduce recurrence of condition will improve Outcome: Progressing Goal: Identification of resources available to assist in meeting health care needs will improve Outcome: Progressing   Problem: Physical Regulation: Goal: Complications related to the disease process, condition or treatment will be avoided or minimized Outcome: Progressing   Problem: Safety: Goal: Ability to remain free from injury will improve Outcome: Progressing   

## 2021-01-22 NOTE — Plan of Care (Signed)
  Problem: Education: Goal: Emotional status will improve Outcome: Progressing Goal: Mental status will improve Outcome: Progressing Goal: Verbalization of understanding the information provided will improve Outcome: Progressing   Problem: Activity: Goal: Interest or engagement in activities will improve Outcome: Progressing   

## 2021-01-22 NOTE — [facility] Group Notes (Signed)
[facility] LCSW Group Therapy Note  Date/Time:  01/22/2021 1:07 PM- 2:00 PM   Type of Therapy and Topic:  Group Therapy:  Healthy and Unhealthy Supports  Participation Level:  Active   Description of Group:  Patients in this group were introduced to the idea of adding a variety of healthy supports to address the various needs in their lives.Patients discussed what additional healthy supports could be helpful in their recovery and wellness after discharge in order to prevent future hospitalizations.   An emphasis was placed on using counselor, doctor, therapy groups, 12-step groups, and problem-specific support groups to expand supports.  They also worked as a group on developing a specific plan for several patients to deal with unhealthy supports through boundary-setting, psychoeducation with loved ones, and even termination of relationships.   Therapeutic Goals:   1)  discuss importance of adding supports to stay well once out of the hospital  2)  compare healthy versus unhealthy supports and identify some examples of each  3)  generate ideas and descriptions of healthy supports that can be added  4)  offer mutual support about how to address unhealthy supports  5)  encourage active participation in and adherence to discharge plan    Summary of Patient Progress:  Patient spoke about not liking his family and that they gave up on him. Patient could not identify any human supports. Patient stated music helps him. Patient stated he plans on changing his life around and being a role model for his younger siblings.    Therapeutic Modalities:   Motivational Interviewing Brief Solution-Focused Therapy  Sharman Cheek 01/22/2021  3:39 PM

## 2021-01-22 NOTE — Progress Notes (Signed)
Patient has been very hyperactive and silly. Socializing with same age peer. Complaining of anxiety and received prn medication. Denies SI, HI and AVH

## 2021-01-22 NOTE — Progress Notes (Signed)
Patient upset about not being discharged. Noted pacing the halls, states its not fair. Reports he signed himself in and should be able to sign himself out. He is wanting to watch the [employer] [place], week 1 of [employer]. Patient given po prn for agitation. Calmed down after staff talked with him.

## 2021-01-22 NOTE — Progress Notes (Signed)
Patient agitated, pacing halls, pressured speech, called 911. Patient reports he is getting out of here no matter what. Patient noted hitting walls and doors.

## 2021-01-22 NOTE — Progress Notes (Signed)
Nursing unit received a call from patient family member stating they were here to pick patient up. Patient informed family member that he had been discharged.

## 2021-01-23 NOTE — Plan of Care (Signed)
  Problem: Education: Goal: Knowledge of [facility] Education information/materials will improve Outcome: Not Progressing Goal: Emotional status will improve Outcome: Not Progressing Goal: Mental status will improve Outcome: Not Progressing   Problem: Activity: Goal: Interest or engagement in activities will improve Outcome: Progressing Goal: Sleeping patterns will improve Outcome: Progressing   Problem: Coping: Goal: Ability to verbalize frustrations and anger appropriately will improve Outcome: Not Progressing Goal: Ability to demonstrate self-control will improve Outcome: Not Progressing   Problem: Activity: Goal: Will verbalize the importance of balancing activity with adequate rest periods Outcome: Progressing   Problem: Education: Goal: Will be free of psychotic symptoms Outcome: Progressing   Problem: Coping: Goal: Coping ability will improve Outcome: Not Progressing Goal: Will verbalize feelings Outcome: Progressing   Problem: Coping: Goal: Coping ability will improve Outcome: Not Progressing Goal: Will verbalize feelings Outcome: Progressing

## 2021-01-23 NOTE — Progress Notes (Signed)
Patients parent "mother/grandmother called" and wanted to speak with staff. RN spoke with her. Per mother/grandmother, patient needs to be in facility longer. Rn received phone number and name   Ledon Snare [place]tic [phone]

## 2021-01-23 NOTE — Progress Notes (Signed)
D: Patient alert and oriented. Received prn mediation for anxiety this am prior to day shift. Pacing the halls frequently this day. Denies SI/HI/AH/VH, anxiety, depression and pain. Participating in groups, interacting appropriately with peers and eating all meals. Taking medications as prescribed and prns as needed.   A: Vital signs and labs monitored. Patient supported emotionally and encouraged to express concerns. Frequent redirection needed throughout the day due to periods of agitation.   R: No adverse reactions from medication noted. Cont Q15 minute checks for safety.

## 2021-01-23 NOTE — Group Note (Signed)
[facility] LCSW Group Therapy Note    Group Date: 01/23/2021 Start Time: 1300 End Time: 1400  Type of Therapy and Topic:  Group Therapy:  Overcoming Obstacles  Participation Level:  [facility] PARTICIPATION LEVEL: Active  Mood:  Description of Group:   In this group patients will be encouraged to explore what they see as obstacles to their own wellness and recovery. They will be guided to discuss their thoughts, feelings, and behaviors related to these obstacles. The group will process together ways to cope with barriers, with attention given to specific choices patients can make. Each patient will be challenged to identify changes they are motivated to make in order to overcome their obstacles. This group will be process-oriented, with patients participating in exploration of their own experiences as well as giving and receiving support and challenge from other group members.  Therapeutic Goals: 1. Patient will identify personal and current obstacles as they relate to admission. 2. Patient will identify barriers that currently interfere with their wellness or overcoming obstacles.  3. Patient will identify feelings, thought process and behaviors related to these barriers. 4. Patient will identify two changes they are willing to make to overcome these obstacles:    Summary of Patient Progress Patient was present in group. Patient speech was fast paced.  Patient was difficult to track with conversation and tangential, however, was able to be redirected.  Patient identified that he is rebuilding his relationships with mother.  Therapeutic Modalities:   Cognitive Behavioral Therapy Solution Focused Therapy Motivational Interviewing Relapse Prevention Therapy   Harden Mo, LCSW

## 2021-01-23 NOTE — Progress Notes (Signed)
[facility] MD Progress Note  01/23/2021 1:36 PM Noah [name]  MRN:  [id number]  CC "I want to go home."  Subjective:   20 year old male who presented voluntarily for labile mood, agitation, and hallucinations. Overnight and this morning patient agitated and requesting PRNs. Medication compliant, attending to ADLs. Patient seen one-on-one this morning. He is extremely labile in mood. At one minute angry and cursing because the techs are 5 minutes late to taking them outside, the next minute in tears speaking about his grandmother. He continues to have pressured speech and flight of ideas. He is showing impairment in judgment. He does requests that I call his grandmother. Call placed to [phone] x [phone]. No answer, and no ability to leave a voicemail. Continues to have no insight into his mental illness.   Principal Problem: Acute psychosis (HCC) Diagnosis: Principal Problem:   Acute psychosis (HCC) Active Problems:   Polysubstance abuse (HCC)  Total Time spent with patient: 30 minutes  Past Psychiatric History: See H&P  Past Medical History: History reviewed. No pertinent past medical history. History reviewed. No pertinent surgical history. Family History: History reviewed. No pertinent family history. Family Psychiatric  History: See H&P Social History:  Social History   Substance and Sexual Activity  Alcohol Use No     Social History   Substance and Sexual Activity  Drug Use No    Social History   Socioeconomic History   Marital status: Single    Spouse name: Not on file   Number of children: Not on file   Years of education: Not on file   Highest education level: Not on file  Occupational History   Not on file  Tobacco Use   Smoking status: Some Days    Types: Cigarettes   Smokeless tobacco: Never  Vaping Use   Vaping Use: Every day  Substance and Sexual Activity   Alcohol use: No   Drug use: No   Sexual activity: Not on file  Other Topics Concern   Not on file   Social History Narrative   Not on file   Social Determinants of Health   Financial Resource Strain: Not on file  Food Insecurity: Not on file  Transportation Needs: Not on file  Physical Activity: Not on file  Stress: Not on file  Social Connections: Not on file   Additional Social History:                         Sleep: Fair  Appetite:  Fair  Current Medications: Current Facility-Administered Medications  Medication Dose Route Frequency Provider Last Rate Last Admin   alum & mag hydroxide-simeth (MAALOX/MYLANTA) 200-200-20 MG/5ML suspension 30 mL  30 mL Oral Q4H PRN Nevan Sans, MD       divalproex (DEPAKOTE) DR tablet 500 mg  500 mg Oral Q12H Les Pou M, MD   500 mg at 01/23/21 0800   feeding supplement (ENSURE ENLIVE / ENSURE PLUS) liquid 237 mL  237 mL Oral TID BM Grayland Sans, MD   237 mL at 01/23/21 1100   hydrOXYzine (ATARAX/VISTARIL) tablet 50 mg  50 mg Oral TID PRN Jonanthony Sans, MD   50 mg at 01/23/21 0627   ibuprofen (ADVIL) tablet 600 mg  600 mg Oral Q6H PRN Terron Sans, MD   600 mg at 01/22/21 2138   magnesium hydroxide (MILK OF MAGNESIA) suspension 30 mL  30 mL Oral Daily PRN Ritter Sans, MD  multivitamin with minerals tablet 1 tablet  1 tablet Oral Daily Bostyn Sans, MD   1 tablet at 01/23/21 0800   nicotine polacrilex (NICORETTE) gum 2 mg  2 mg Oral Q4H PRN Darrly Sans, MD   2 mg at 01/23/21 1042   OLANZapine (ZYPREXA) tablet 10 mg  10 mg Oral Q6H PRN Treyven Sans, MD   10 mg at 01/23/21 1017   OLANZapine zydis (ZYPREXA) disintegrating tablet 10 mg  10 mg Oral Daily Amron Sans, MD   10 mg at 01/23/21 0800   OLANZapine zydis (ZYPREXA) disintegrating tablet 20 mg  20 mg Oral QHS Mandell Sans, MD   20 mg at 01/22/21 2143   traZODone (DESYREL) tablet 100 mg  100 mg Oral QHS PRN Dujuan Sans, MD   100 mg at 01/22/21 2138   ziprasidone (GEODON) injection 20 mg  20 mg Intramuscular Q6H PRN Susan Sans, MD        Lab Results:  No results found for this or any previous visit (from the past 48 hour(s)).   Blood Alcohol level:  Lab Results  Component Value Date   ETH 12 (H) 01/17/2021    Metabolic Disorder Labs: Lab Results  Component Value Date   HGBA1C 5.3 01/19/2021   MPG 105.41 01/19/2021   No results found for: PROLACTIN Lab Results  Component Value Date   CHOL 155 01/19/2021   TRIG 95 01/19/2021   HDL 58 01/19/2021   CHOLHDL 2.7 01/19/2021   VLDL 19 01/19/2021   LDLCALC 78 01/19/2021    Physical Findings: AIMS:  , ,  ,  ,    CIWA:    COWS:     Musculoskeletal: Strength & Muscle Tone: within normal limits Gait & Station: normal Patient leans: N/A  Psychiatric Specialty Exam:  Presentation  General Appearance: Appropriate for Environment; Casual  Eye Contact:Good  Speech:Pressured  Speech Volume:Normal  Handedness:Right   Mood and Affect  Mood:Labile Affect:Labile, tearful  Thought Process  Thought Processes: perseveration Descriptions of Associations:Intact  Orientation:Full (Time, Place and Person)  Thought Content:Less scattered History of Schizophrenia/Schizoaffective disorder:No  Duration of Psychotic Symptoms:Less than six months  Hallucinations:Denies, observed responding to internal stimuli  Ideas of Reference:Delusions  Suicidal Thoughts:Denies  Homicidal Thoughts:Denies   Sensorium  Memory:Immediate Poor; Recent Poor; Remote Poor  Judgment:Impaired  Insight:Lacking   Executive Functions  Concentration:Fair  Attention Span:Fair  Recall:Fair Fund of Knowledge:Fair Language:Fair   Psychomotor Activity  Psychomotor Activity:Restless   Assets  Assets:Financial Resources/Insurance; Physical Health   Sleep  Sleep:Fair, 7    Physical Exam: Physical Exam ROS Blood pressure 139/80, pulse (!) 101, temperature 98 F (36.7 C), temperature source Oral, resp. rate 18, height 5\' 8"  (1.727 m), weight  54.4 kg, SpO2 99 %. Body mass index is 18.25 kg/m.   Treatment Plan Summary: Daily contact with patient to assess and evaluate symptoms and progress in treatment and Medication management 20 year old male presenting for acute psychosis. Differential diagnosis includes Bipolar I disorder, current episode manic with psychotic features, Brief psychotic disorder; or substance induced psychosis. 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  Continue Olanzapine 10 mg in the morning and 20 mg in the evening.  Start Depakote 500 mg BID. Patient remains labile on exam with pressured speech, flight of ideas, and severely impaired judgment. Attempt made to contact grandmother per patient request, but was unsuccessful.    21, MD 01/23/2021, 1:36 PM

## 2021-01-24 NOTE — Progress Notes (Signed)
D: Pt alert and oriented. Pt rates depression 0/10, hopelessness 0/10, and anxiety 0/10. Pt goal: "Getting my priority's straight and never looking back." Pt reports energy level as normal and concentration as being good. Pt reports sleep last night as being good. Pt did receive medications for sleep and did find them helpful. Pt denies experiencing any pain at this time. Pt denies experiencing any SI/HI, or AVH at this time.   Pt can be observed in the milieu interacting with others and pacing the halls.  A: Scheduled medications administered to pt, per MD orders. Support and encouragement provided. Frequent verbal contact made. Routine safety checks conducted q15 minutes.   R: No adverse drug reactions noted. Pt verbally contracts for safety at this time. Pt complaint with medications and treatment plan. Pt interacts well with others on the unit. Pt remains safe at this time. Will continue to monitor.

## 2021-01-24 NOTE — Progress Notes (Signed)
Patient presents hyperactive, pacing the unit. Pt has rapid pressured speech, denies SI/HI/AVH. Pt observed interacting appropriately with staff and peers on the unit. Pt compliant with medication administration per MD orders. Pt given education, support, and encouragement to be active in his treatment plan. Pt being monitored Q 15 minutes for safety per unit protocol. Pt remains safe on the unit.

## 2021-01-24 NOTE — Progress Notes (Signed)
[facility] MD Progress Note  01/24/2021 10:35 AM Noah [name]  MRN:  [id number]  CC "As long as I know what is happening"  Subjective:   20 year old male who presented voluntarily for labile mood, agitation, and hallucinations. Overnight patient in verbal altercation with a peer and needed PRN medication. Medication compliant, ADLs intact. Patient seen one-on-one today. He enquires if we have spoken to his family. Was informed that family had called stating they felt he was not ready to return home. Discussed patient's diagnosis of bipolar disorder, and indication for each medication. Patient is agreeable, and states his mom has bipolar disorder, and told him he had it too. He is suprisingly calm when told he is not discharging today, and states "as long as I know what is happening."   Noah [name], [phone] called yesterday evening to inform staff that he needed to be here longer. Attempts made to return call for further collateral today, but line rings continuously without ability to leave voicemail.   Principal Problem: Acute psychosis (HCC) Diagnosis: Principal Problem:   Acute psychosis (HCC) Active Problems:   Polysubstance abuse (HCC)  Total Time spent with patient: 30 minutes  Past Psychiatric History: See H&P  Past Medical History: History reviewed. No pertinent past medical history. History reviewed. No pertinent surgical history. Family History: History reviewed. No pertinent family history. Family Psychiatric  History: See H&P Social History:  Social History   Substance and Sexual Activity  Alcohol Use No     Social History   Substance and Sexual Activity  Drug Use No    Social History   Socioeconomic History   Marital status: Single    Spouse name: Not on file   Number of children: Not on file   Years of education: Not on file   Highest education level: Not on file  Occupational History   Not on file  Tobacco Use   Smoking status: Some Days    Types:  Cigarettes   Smokeless tobacco: Never  Vaping Use   Vaping Use: Every day  Substance and Sexual Activity   Alcohol use: No   Drug use: No   Sexual activity: Not on file  Other Topics Concern   Not on file  Social History Narrative   Not on file   Social Determinants of Health   Financial Resource Strain: Not on file  Food Insecurity: Not on file  Transportation Needs: Not on file  Physical Activity: Not on file  Stress: Not on file  Social Connections: Not on file   Additional Social History:                         Sleep: Fair  Appetite:  Fair  Current Medications: Current Facility-Administered Medications  Medication Dose Route Frequency Provider Last Rate Last Admin   alum & mag hydroxide-simeth (MAALOX/MYLANTA) 200-200-20 MG/5ML suspension 30 mL  30 mL Oral Q4H PRN Noah Sans, MD       divalproex (DEPAKOTE) DR tablet 500 mg  500 mg Oral Q12H Noah Sans, MD   500 mg at 01/24/21 0814   feeding supplement (ENSURE ENLIVE / ENSURE PLUS) liquid 237 mL  237 mL Oral TID BM Noah Sans, MD   237 mL at 01/24/21 0955   hydrOXYzine (ATARAX/VISTARIL) tablet 50 mg  50 mg Oral TID PRN Noah Sans, MD   50 mg at 01/24/21 0625   ibuprofen (ADVIL) tablet 600 mg  600 mg Oral Q6H  PRN Noah Sans, MD   600 mg at 01/22/21 2138   magnesium hydroxide (MILK OF MAGNESIA) suspension 30 mL  30 mL Oral Daily PRN Noah Sans, MD       multivitamin with minerals tablet 1 tablet  1 tablet Oral Daily Noah Sans, MD   1 tablet at 01/24/21 7829   nicotine polacrilex (NICORETTE) gum 2 mg  2 mg Oral Q4H PRN Noah Sans, MD   2 mg at 01/24/21 0625   OLANZapine (ZYPREXA) tablet 10 mg  10 mg Oral Q6H PRN Noah Sans, MD   10 mg at 01/23/21 2018   OLANZapine zydis (ZYPREXA) disintegrating tablet 10 mg  10 mg Oral Daily Noah Sans, MD   10 mg at 01/24/21 0814   OLANZapine zydis (ZYPREXA) disintegrating tablet 20 mg  20 mg Oral QHS Noah Sans,  MD   20 mg at 01/23/21 2114   traZODone (DESYREL) tablet 100 mg  100 mg Oral QHS PRN Noah Sans, MD   100 mg at 01/23/21 2258   ziprasidone (GEODON) injection 20 mg  20 mg Intramuscular Q6H PRN Noah Sans, MD        Lab Results:  No results found for this or any previous visit (from the past 48 hour(s)).   Blood Alcohol level:  Lab Results  Component Value Date   ETH 12 (H) 01/17/2021    Metabolic Disorder Labs: Lab Results  Component Value Date   HGBA1C 5.3 01/19/2021   MPG 105.41 01/19/2021   No results found for: PROLACTIN Lab Results  Component Value Date   CHOL 155 01/19/2021   TRIG 95 01/19/2021   HDL 58 01/19/2021   CHOLHDL 2.7 01/19/2021   VLDL 19 01/19/2021   LDLCALC 78 01/19/2021    Physical Findings: AIMS:  , ,  ,  ,    CIWA:    COWS:     Musculoskeletal: Strength & Muscle Tone: within normal limits Gait & Station: normal Patient leans: N/A  Psychiatric Specialty Exam:  Presentation  General Appearance: Appropriate for Environment; Casual  Eye Contact:Good  Speech:Pressured  Speech Volume:Normal  Handedness:Right   Mood and Affect  Mood:Labile Affect:Labile, tearful  Thought Process  Thought Processes: perseveration Descriptions of Associations:Intact  Orientation:Full (Time, Place and Person)  Thought Content:Less scattered History of Schizophrenia/Schizoaffective disorder:No  Duration of Psychotic Symptoms:Less than six months  Hallucinations:Denies, observed responding to internal stimuli  Ideas of Reference:Delusions  Suicidal Thoughts:Denies  Homicidal Thoughts:Denies   Sensorium  Memory:Immediate Poor; Recent Poor; Remote Poor  Judgment:Impaired  Insight:Lacking   Executive Functions  Concentration:Fair  Attention Span:Fair  Recall:Fair Fund of Knowledge:Fair Language:Fair   Psychomotor Activity  Psychomotor Activity:Restless   Assets  Assets:Financial Resources/Insurance; Physical  Health   Sleep  Sleep:Fair, 6 hours    Physical Exam: Physical Exam ROS Blood pressure 127/77, pulse 90, temperature 97.9 F (36.6 C), temperature source Oral, resp. rate 18, height 5\' 8"  (1.727 m), weight 54.4 kg, SpO2 100 %. Body mass index is 18.25 kg/m.   Treatment Plan Summary: Daily contact with patient to assess and evaluate symptoms and progress in treatment and Medication management 21 year old male presenting for acute psychosis. Diagnosis Bipolar I Disorder, current episode manic with psychotic features. Continue Olanzapine 10 mg in the morning and 20 mg in the evening.  Continue Depakote 500 mg BID, check level morning of 9/15. Patient remains labile on exam with pressured speech, flight of ideas, and severely impaired judgment.  Noah Sans, MD 01/24/2021, 10:35 AM

## 2021-01-24 NOTE — Plan of Care (Signed)
  Problem: Education: Goal: Knowledge of [facility] Education information/materials will improve Outcome: Progressing Goal: Emotional status will improve Outcome: Progressing Goal: Mental status will improve Outcome: Progressing Goal: Verbalization of understanding the information provided will improve Outcome: Progressing   Problem: Activity: Goal: Interest or engagement in activities will improve Outcome: Progressing Goal: Sleeping patterns will improve Outcome: Progressing   Problem: Coping: Goal: Ability to verbalize frustrations and anger appropriately will improve Outcome: Progressing Goal: Ability to demonstrate self-control will improve Outcome: Progressing   Problem: Health Behavior/Discharge Planning: Goal: Identification of resources available to assist in meeting health care needs will improve Outcome: Progressing Goal: Compliance with treatment plan for underlying cause of condition will improve Outcome: Progressing   Problem: Physical Regulation: Goal: Ability to maintain clinical measurements within normal limits will improve Outcome: Progressing   Problem: Safety: Goal: Periods of time without injury will increase Outcome: Progressing   Problem: Activity: Goal: Will verbalize the importance of balancing activity with adequate rest periods Outcome: Progressing   Problem: Education: Goal: Will be free of psychotic symptoms Outcome: Progressing Goal: Knowledge of the prescribed therapeutic regimen will improve Outcome: Progressing   Problem: Coping: Goal: Coping ability will improve Outcome: Progressing Goal: Will verbalize feelings Outcome: Progressing   Problem: Health Behavior/Discharge Planning: Goal: Compliance with prescribed medication regimen will improve Outcome: Progressing   Problem: Nutritional: Goal: Ability to achieve adequate nutritional intake will improve Outcome: Progressing   Problem: Role Relationship: Goal:  Ability to communicate needs accurately will improve Outcome: Progressing Goal: Ability to interact with others will improve Outcome: Progressing   Problem: Safety: Goal: Ability to redirect hostility and anger into socially appropriate behaviors will improve Outcome: Progressing Goal: Ability to remain free from injury will improve Outcome: Progressing   Problem: Self-Care: Goal: Ability to participate in self-care as condition permits will improve Outcome: Progressing   Problem: Self-Concept: Goal: Will verbalize positive feelings about self Outcome: Progressing   Problem: Education: Goal: Knowledge of disease or condition will improve Outcome: Progressing Goal: Understanding of discharge needs will improve Outcome: Progressing   Problem: Health Behavior/Discharge Planning: Goal: Ability to identify changes in lifestyle to reduce recurrence of condition will improve Outcome: Progressing Goal: Identification of resources available to assist in meeting health care needs will improve Outcome: Progressing   Problem: Physical Regulation: Goal: Complications related to the disease process, condition or treatment will be avoided or minimized Outcome: Progressing   Problem: Safety: Goal: Ability to remain free from injury will improve Outcome: Progressing   

## 2021-01-24 NOTE — Group Note (Addendum)
[facility] LCSW Group Therapy Note   Group Date: 01/24/2021 Start Time: 1300 End Time: 1400  Type of Therapy/Topic:  Group Therapy:  Feelings about Diagnosis  Participation Level:  Active   Mood: euphoric w/ pressured speech    Description of Group:    This group will allow patients to explore their thoughts and feelings about diagnoses they have received. Patients will be guided to explore their level of understanding and acceptance of these diagnoses. Facilitator will encourage patients to process their thoughts and feelings about the reactions of others to their diagnosis, and will guide patients in identifying ways to discuss their diagnosis with significant others in their lives. This group will be process-oriented, with patients participating in exploration of their own experiences as well as giving and receiving support and challenge from other group members.   Therapeutic Goals: 1. Patient will demonstrate understanding of diagnosis as evidence by identifying two or more symptoms of the disorder:  2. Patient will be able to express two feelings regarding the diagnosis 3. Patient will demonstrate ability to communicate their needs through discussion and/or role plays  Summary of Patient Progress: Patient was present for the entirety of the group session. Patient was an active listener and participated in the topic of discussion, provided helpful advice to others, and added nuance to topic of conversation. Patient had difficulty staying on topic and appeared confused at times. Patient eventually derailed conversation to why his family is still "keeping (him) here."  Patient able to be redirected.     Therapeutic Modalities:   Cognitive Behavioral Therapy Brief Therapy Feelings Identification    Corky Crafts, [place]

## 2021-01-24 NOTE — Progress Notes (Signed)
Patient got into a verbal altercation with a peer when he assumed that the peer had eaten another patient's food and got loud and accusatory, yelling and being verbally aggressive. The two patients were separated. Patient received prn medication and room was moved. Denies SI, HI and AVH

## 2021-01-25 LAB — VALPROIC ACID LEVEL: Valproic Acid Lvl: 52 ug/mL (ref 50.0–100.0)

## 2021-01-25 NOTE — Plan of Care (Signed)
  Problem: Education: Goal: Knowledge of [facility] Education information/materials will improve Outcome: Progressing Goal: Emotional status will improve Outcome: Progressing Goal: Mental status will improve Outcome: Progressing Goal: Verbalization of understanding the information provided will improve Outcome: Progressing   Problem: Activity: Goal: Interest or engagement in activities will improve Outcome: Progressing Goal: Sleeping patterns will improve Outcome: Progressing   Problem: Coping: Goal: Ability to verbalize frustrations and anger appropriately will improve Outcome: Progressing Goal: Ability to demonstrate self-control will improve Outcome: Progressing   

## 2021-01-25 NOTE — Progress Notes (Signed)
[facility] MD Progress Note  01/25/2021 10:50 AM Noah [name]  MRN:  [id number]  CC "Need to get out of here."  Subjective:  20 year old male who presented voluntarily for labile mood, agitation, and hallucinations. No acute events overnight, medication compliant, attending to ADLs. Patient seen one-on-one this morning. He continues to have pressured speech and is somewhat irritable today. He continues to perseverate on discharge, however, is unable to give any logical plan for future. He is unable to tell me where he will be staying, if he has a source of income, etc. He continues to have delusions about becoming an [employer] or [employer] player.   Principal Problem: Acute psychosis (HCC) Diagnosis: Principal Problem:   Acute psychosis (HCC) Active Problems:   Polysubstance abuse (HCC)  Total Time spent with patient: 30 minutes  Past Psychiatric History: See H&P  Past Medical History: History reviewed. No pertinent past medical history. History reviewed. No pertinent surgical history. Family History: History reviewed. No pertinent family history. Family Psychiatric  History: See H&P, patient does not that his mother has bipolar disorder   Social History:  Social History   Substance and Sexual Activity  Alcohol Use No     Social History   Substance and Sexual Activity  Drug Use No    Social History   Socioeconomic History   Marital status: Single    Spouse name: Not on file   Number of children: Not on file   Years of education: Not on file   Highest education level: Not on file  Occupational History   Not on file  Tobacco Use   Smoking status: Some Days    Types: Cigarettes   Smokeless tobacco: Never  Vaping Use   Vaping Use: Every day  Substance and Sexual Activity   Alcohol use: No   Drug use: No   Sexual activity: Not on file  Other Topics Concern   Not on file  Social History Narrative   Not on file   Social Determinants of Health   Financial Resource Strain: Not on  file  Food Insecurity: Not on file  Transportation Needs: Not on file  Physical Activity: Not on file  Stress: Not on file  Social Connections: Not on file   Additional Social History:                         Sleep: Fair  Appetite:  Fair  Current Medications: Current Facility-Administered Medications  Medication Dose Route Frequency Provider Last Rate Last Admin   alum & mag hydroxide-simeth (MAALOX/MYLANTA) 200-200-20 MG/5ML suspension 30 mL  30 mL Oral Q4H PRN Ousmane Sans, MD   30 mL at 01/24/21 1959   divalproex (DEPAKOTE) DR tablet 500 mg  500 mg Oral Q12H Denzell Sans, MD   500 mg at 01/25/21 0820   feeding supplement (ENSURE ENLIVE / ENSURE PLUS) liquid 237 mL  237 mL Oral TID BM Seibert Sans, MD   237 mL at 01/24/21 2114   hydrOXYzine (ATARAX/VISTARIL) tablet 50 mg  50 mg Oral TID PRN Kaevion Sans, MD   50 mg at 01/25/21 0701   ibuprofen (ADVIL) tablet 600 mg  600 mg Oral Q6H PRN Needham Sans, MD   600 mg at 01/22/21 2138   magnesium hydroxide (MILK OF MAGNESIA) suspension 30 mL  30 mL Oral Daily PRN Hakim Sans, MD       multivitamin with minerals tablet 1 tablet  1 tablet  Oral Daily Johngabriel Sans, MD   1 tablet at 01/25/21 0820   nicotine polacrilex (NICORETTE) gum 2 mg  2 mg Oral Q4H PRN Graeme Sans, MD   2 mg at 01/25/21 0701   OLANZapine (ZYPREXA) tablet 10 mg  10 mg Oral Q6H PRN Teegan Sans, MD   10 mg at 01/23/21 2018   OLANZapine zydis (ZYPREXA) disintegrating tablet 10 mg  10 mg Oral Daily Harjot Sans, MD   10 mg at 01/25/21 0819   OLANZapine zydis (ZYPREXA) disintegrating tablet 20 mg  20 mg Oral QHS Ayuub Sans, MD   20 mg at 01/24/21 2112   traZODone (DESYREL) tablet 100 mg  100 mg Oral QHS PRN Verdie Sans, MD   100 mg at 01/24/21 2112   ziprasidone (GEODON) injection 20 mg  20 mg Intramuscular Q6H PRN Gearld Sans, MD        Lab Results: No results found for this or any previous visit (from the  past 48 hour(s)).  Blood Alcohol level:  Lab Results  Component Value Date   ETH 12 (H) 01/17/2021    Metabolic Disorder Labs: Lab Results  Component Value Date   HGBA1C 5.3 01/19/2021   MPG 105.41 01/19/2021   No results found for: PROLACTIN Lab Results  Component Value Date   CHOL 155 01/19/2021   TRIG 95 01/19/2021   HDL 58 01/19/2021   CHOLHDL 2.7 01/19/2021   VLDL 19 01/19/2021   LDLCALC 78 01/19/2021    Physical Findings: AIMS:  , ,  ,  ,    CIWA:    COWS:     Musculoskeletal: Strength & Muscle Tone: within normal limits Gait & Station: normal Patient leans: N/A  Psychiatric Specialty Exam:  Presentation  General Appearance: Appropriate for Environment; Casual  Eye Contact:Fair  Speech:Pressured  Speech Volume:Normal  Handedness:Right   Mood and Affect  Mood:Irritable  Affect:Congruent   Thought Process  Thought Processes:Goal Directed  Descriptions of Associations:Intact  Orientation:Full (Time, Place and Person)  Thought Content:Rumination  History of Schizophrenia/Schizoaffective disorder:No  Duration of Psychotic Symptoms:Less than six months  Hallucinations:Hallucinations: None  Ideas of Reference:None  Suicidal Thoughts:Suicidal Thoughts: No  Homicidal Thoughts:Homicidal Thoughts: No   Sensorium  Memory:Immediate Fair; Recent Fair; Remote Fair  Judgment:Intact  Insight:Present   Executive Functions  Concentration:Fair  Attention Span:Fair  Recall:Fair  Fund of Knowledge:Fair  Language:Fair   Psychomotor Activity  Psychomotor Activity:Psychomotor Activity: Normal   Assets  Assets:Communication Skills; Physical Health; Resilience; Social Support; Talents/Skills   Sleep  Sleep:Sleep: Fair Number of Hours of Sleep: 6    Physical Exam: Physical Exam ROS Blood pressure 130/88, pulse 83, temperature 98 F (36.7 C), temperature source Oral, resp. rate 16, height 5\' 8"  (1.727 m), weight 54.4 kg, SpO2  100 %. Body mass index is 18.25 kg/m.   Treatment Plan Summary: Daily contact with patient to assess and evaluate symptoms and progress in treatment and Medication management 20 year old male presenting for acute psychosis. Diagnosis Bipolar I Disorder, current episode manic with psychotic features. Continue Olanzapine 10 mg in the morning and 20 mg in the evening.  Continue Depakote 500 mg BID, check level tonight. Patient remains irritable on exam with poor judgment.   21, MD 01/25/2021, 10:50 AM

## 2021-01-25 NOTE — Progress Notes (Signed)
Recreation Therapy Notes  Date: 01/25/2021  Time: 9:45 am   Location: Courtyard    Behavioral response: Appropriate  Intervention Topic: Leisure    Discussion/Intervention:  Group content today was focused on leisure. The group defined what leisure is and some positive leisure activities they participate in. Individuals identified the difference between good and bad leisure. Participants expressed how they feel after participating in the leisure of their choice. The group discussed how they go about picking a leisure activity and if others are involved in their leisure activities. The patient stated how many leisure activities they have to choose from and reasons why it is important to have leisure time. Individuals participated in the intervention "Exploration of Leisure" where they had a chance to identify new leisure activities as well as benefits of leisure. Clinical Observations/Feedback: Patient came to group late due to unknown reasons. Individual was social with peers and staff while participating in the intervention.  Raahi Korber LRT/CTRS          Evander Macaraeg 01/25/2021 11:54 AM

## 2021-01-25 NOTE — Progress Notes (Signed)
Patient is pleasant and cooperative this evening. Has been very active on the unit hanging out with other peers close in age. He also continues to pace the halls.  Discussed verbalizing symptoms and requesting medication before symptoms become unmanageable.  He denies si/hi/avh/and pain.  He does continue to endorse depression which he states is mild, and anxiety for which he is seeking medication to help reduce.  Will continue to monitor with q15 minute safety checks. Encouraged him to seek nurse with any concerns.    Cleo Butler-Nicholson, LPN

## 2021-01-25 NOTE — Group Note (Signed)
[facility] LCSW Group Therapy Note   Group Date: 01/25/2021 Start Time: 1310 End Time: 1400   Type of Therapy/Topic:  Group Therapy:  Emotion Regulation  Participation Level:  Active   Mood:  Description of Group:    The purpose of this group is to assist patients in learning to regulate negative emotions and experience positive emotions. Patients will be guided to discuss ways in which they have been vulnerable to their negative emotions. These vulnerabilities will be juxtaposed with experiences of positive emotions or situations, and patients challenged to use positive emotions to combat negative ones. Special emphasis will be placed on coping with negative emotions in conflict situations, and patients will process healthy conflict resolution skills.  Therapeutic Goals: Patient will identify two positive emotions or experiences to reflect on in order to balance out negative emotions:  Patient will label two or more emotions that they find the most difficult to experience:  Patient will be able to demonstrate positive conflict resolution skills through discussion or role plays:   Summary of Patient Progress: Patient was present for the entirety of group. He spoke about his experiences but was unable to identify any emotions outside of anger. Most of pt's comments were loosely related to the comments made in class.  Therapeutic Modalities:   Cognitive Behavioral Therapy Feelings Identification Dialectical Behavioral Therapy   Glenis Smoker, LCSW

## 2021-01-25 NOTE — Plan of Care (Signed)
Patient was little irritable with pressured speech and hyper verbal this morning. Vistaril given x 2 with good result. Denies SI,HI and AVH. ADLs maintained. Appetite and energy level good.Patient wrote his his goal for today is " to keep straight going for and never give up." Support and encouragement given.

## 2021-01-25 NOTE — BH IP Treatment Plan (Signed)
Interdisciplinary Treatment and Diagnostic Plan Update  01/25/2021 Time of Session: 8:30 AM SMARAN GAUS MRN: [id number]  Principal Diagnosis: Acute psychosis [facility])  Secondary Diagnoses: Principal Problem:   Acute psychosis (HCC) Active Problems:   Polysubstance abuse (HCC)   Current Medications:  Current Facility-Administered Medications  Medication Dose Route Frequency Provider Last Rate Last Admin   alum & mag hydroxide-simeth (MAALOX/MYLANTA) 200-200-20 MG/5ML suspension 30 mL  30 mL Oral Q4H PRN Terrell Sans, MD   30 mL at 01/24/21 1959   divalproex (DEPAKOTE) DR tablet 500 mg  500 mg Oral Q12H Arek Sans, MD   500 mg at 01/25/21 0820   feeding supplement (ENSURE ENLIVE / ENSURE PLUS) liquid 237 mL  237 mL Oral TID BM Jamarkus Sans, MD   237 mL at 01/24/21 2114   hydrOXYzine (ATARAX/VISTARIL) tablet 50 mg  50 mg Oral TID PRN Axtyn Sans, MD   50 mg at 01/25/21 0701   ibuprofen (ADVIL) tablet 600 mg  600 mg Oral Q6H PRN Kinston Sans, MD   600 mg at 01/22/21 2138   magnesium hydroxide (MILK OF MAGNESIA) suspension 30 mL  30 mL Oral Daily PRN Welles Sans, MD       multivitamin with minerals tablet 1 tablet  1 tablet Oral Daily Amaar Sans, MD   1 tablet at 01/25/21 0820   nicotine polacrilex (NICORETTE) gum 2 mg  2 mg Oral Q4H PRN Jaquarious Sans, MD   2 mg at 01/25/21 0701   OLANZapine (ZYPREXA) tablet 10 mg  10 mg Oral Q6H PRN Rhyatt Sans, MD   10 mg at 01/23/21 2018   OLANZapine zydis (ZYPREXA) disintegrating tablet 10 mg  10 mg Oral Daily Aravind Sans, MD   10 mg at 01/25/21 0819   OLANZapine zydis (ZYPREXA) disintegrating tablet 20 mg  20 mg Oral QHS Rhyker Sans, MD   20 mg at 01/24/21 2112   traZODone (DESYREL) tablet 100 mg  100 mg Oral QHS PRN Josedaniel Sans, MD   100 mg at 01/24/21 2112   ziprasidone (GEODON) injection 20 mg  20 mg Intramuscular Q6H PRN Arsh Sans, MD       PTA Medications: Medications Prior to  Admission  Medication Sig Dispense Refill Last Dose   ibuprofen (ADVIL,MOTRIN) 600 MG tablet Take 1 tablet (600 mg total) by mouth every 6 (six) hours as needed. (Patient not taking: No sig reported) 30 tablet 0     Patient Stressors: Medication change or noncompliance   Substance abuse    Patient Strengths: Ability for insight  Communication skills  Physical Health  Supportive family/friends   Treatment Modalities: Medication Management, Group therapy, Case management,  1 to 1 session with clinician, Psychoeducation, Recreational therapy.   Physician Treatment Plan for Primary Diagnosis: Acute psychosis (HCC) Long Term Goal(s): Improvement in symptoms so as ready for discharge   Short Term Goals: Ability to identify changes in lifestyle to reduce recurrence of condition will improve Ability to identify triggers associated with substance abuse/mental health issues will improve Ability to verbalize feelings will improve Ability to disclose and discuss suicidal ideas Ability to demonstrate self-control will improve Ability to identify and develop effective coping behaviors will improve Ability to maintain clinical measurements within normal limits will improve Compliance with prescribed medications will improve  Medication Management: Evaluate patient's response, side effects, and tolerance of medication regimen.  Therapeutic Interventions: 1 to 1 sessions, Unit Group sessions and Medication  administration.  Evaluation of Outcomes: Progressing  Physician Treatment Plan for Secondary Diagnosis: Principal Problem:   Acute psychosis (HCC) Active Problems:   Polysubstance abuse (HCC)  Long Term Goal(s): Improvement in symptoms so as ready for discharge   Short Term Goals: Ability to identify changes in lifestyle to reduce recurrence of condition will improve Ability to identify triggers associated with substance abuse/mental health issues will improve Ability to verbalize  feelings will improve Ability to disclose and discuss suicidal ideas Ability to demonstrate self-control will improve Ability to identify and develop effective coping behaviors will improve Ability to maintain clinical measurements within normal limits will improve Compliance with prescribed medications will improve     Medication Management: Evaluate patient's response, side effects, and tolerance of medication regimen.  Therapeutic Interventions: 1 to 1 sessions, Unit Group sessions and Medication administration.  Evaluation of Outcomes: Progressing   RN Treatment Plan for Primary Diagnosis: Acute psychosis (HCC) Long Term Goal(s): Knowledge of disease and therapeutic regimen to maintain health will improve  Short Term Goals: Ability to remain free from injury will improve, Ability to verbalize frustration and anger appropriately will improve, Ability to demonstrate self-control, Ability to participate in decision making will improve, Ability to verbalize feelings will improve, Ability to identify and develop effective coping behaviors will improve, and Compliance with prescribed medications will improve  Medication Management: RN will administer medications as ordered by provider, will assess and evaluate patient's response and provide education to patient for prescribed medication. RN will report any adverse and/or side effects to prescribing provider.  Therapeutic Interventions: 1 on 1 counseling sessions, Psychoeducation, Medication administration, Evaluate responses to treatment, Monitor vital signs and CBGs as ordered, Perform/monitor CIWA, COWS, AIMS and Fall Risk screenings as ordered, Perform wound care treatments as ordered.  Evaluation of Outcomes: Progressing   LCSW Treatment Plan for Primary Diagnosis: Acute psychosis (HCC) Long Term Goal(s): Safe transition to appropriate next level of care at discharge, Engage patient in therapeutic group addressing interpersonal  concerns.  Short Term Goals: Engage patient in aftercare planning with referrals and resources, Increase social support, Facilitate acceptance of mental health diagnosis and concerns, Identify triggers associated with mental health/substance abuse issues, and Increase skills for wellness and recovery  Therapeutic Interventions: Assess for all discharge needs, 1 to 1 time with Social worker, Explore available resources and support systems, Assess for adequacy in community support network, Educate family and significant other(s) on suicide prevention, Complete Psychosocial Assessment, Interpersonal group therapy.  Evaluation of Outcomes: Progressing   Progress in Treatment: Attending groups: Yes. Participating in groups: Yes. Taking medication as prescribed: Yes. Toleration medication: Yes. Family/Significant other contact made: No, will contact:  if given permission.  Patient understands diagnosis: No. Discussing patient identified problems/goals with staff: Yes. Medical problems stabilized or resolved: Yes. Denies suicidal/homicidal ideation: Yes. Issues/concerns per patient self-inventory: No. Other: None.  New problem(s) identified: No, Describe:  none.  New Short Term/Long Term Goal(s): detox, elimination of symptoms of psychosis, medication management for mood stabilization; elimination of SI thoughts; development of comprehensive mental wellness/sobriety plan. Update 01/25/21: No changes at this time.   Patient Goals: "Fix myself a bit." Update 01/25/21: No changes at this time.    Discharge Plan or Barriers: CSW will assist pt with development of an appropriate aftercare/discharge plan. Update 01/25/21: No changes at this time.   Reason for Continuation of Hospitalization: Aggression Mania Medication stabilization  Estimated Length of Stay: TBD   Scribe for Treatment Team: Glenis Smoker, LCSW 01/25/2021 10:26 AM

## 2021-01-26 MED ORDER — DIVALPROEX SODIUM 500 MG PO DR TAB
750.0000 mg | DELAYED_RELEASE_TABLET | Freq: Every day | ORAL | Status: DC
  Administered 2021-01-26 – 2021-01-29 (×4): 750 mg via ORAL
  Filled 2021-01-26 (×5): qty 1

## 2021-01-26 MED ORDER — DIVALPROEX SODIUM 500 MG PO DR TAB
500.0000 mg | DELAYED_RELEASE_TABLET | Freq: Every morning | ORAL | Status: DC
  Administered 2021-01-27 – 2021-02-01 (×6): 500 mg via ORAL
  Filled 2021-01-26 (×6): qty 1

## 2021-01-26 NOTE — Group Note (Signed)
[facility] LCSW Group Therapy Note   Group Date: 01/26/2021 Start Time: 1300 End Time: 1400   Type of Therapy/Topic:  Group Therapy:  Balance in Life  Participation Level:  Did Not Attend   Description of Group:    This group will address the concept of balance and how it feels and looks when one is unbalanced. Patients will be encouraged to process areas in their lives that are out of balance, and identify reasons for remaining unbalanced. Facilitators will guide patients utilizing problem- solving interventions to address and correct the stressor making their life unbalanced. Understanding and applying boundaries will be explored and addressed for obtaining  and maintaining a balanced life. Patients will be encouraged to explore ways to assertively make their unbalanced needs known to significant others in their lives, using other group members and facilitator for support and feedback.  Therapeutic Goals: Patient will identify two or more emotions or situations they have that consume much of in their lives. Patient will identify signs/triggers that life has become out of balance:  Patient will identify two ways to set boundaries in order to achieve balance in their lives:  Patient will demonstrate ability to communicate their needs through discussion and/or role plays  Summary of Patient Progress: Patient was present in group.  Patient was talkative and had to be redirected several times to remain on topic.  Patient was easier to redirect and open to input from others.  Patient was helpful and supportive of others.  Patient shared his struggles and possible areas of growth.    Therapeutic Modalities:   Cognitive Behavioral Therapy Solution-Focused Therapy Assertiveness Training   Harden Mo, LCSW

## 2021-01-26 NOTE — Progress Notes (Signed)
[facility] MD Progress Note  01/26/2021 11:05 AM [name]  MRN:  [id number]  CC "Can I discharge?"  Subjective:  20 year old male who presented voluntarily for labile mood, agitation, and hallucinations. No acute events overnight, medication compliant, attending to ADLs. Patient seen one-on-one today. He continues to have elevated mood, pressured speech, and grandiose delusions. VPA level subtherapeutic at 52, will increase dose today. Patient continues to show poor insight into his mental illness, and poor reasoning and judgment. Still unable to articulate a safe discharge plan.   Principal Problem: Acute psychosis (HCC) Diagnosis: Principal Problem:   Acute psychosis (HCC) Active Problems:   Polysubstance abuse (HCC)  Total Time spent with patient: 30 minutes  Past Psychiatric History: See H&P  Past Medical History: History reviewed. No pertinent past medical history. History reviewed. No pertinent surgical history. Family History: History reviewed. No pertinent family history. Family Psychiatric  History: See H&P, patient does note that his mother has bipolar disorder   Social History:  Social History   Substance and Sexual Activity  Alcohol Use No     Social History   Substance and Sexual Activity  Drug Use No    Social History   Socioeconomic History   Marital status: Single    Spouse name: Not on file   Number of children: Not on file   Years of education: Not on file   Highest education level: Not on file  Occupational History   Not on file  Tobacco Use   Smoking status: Some Days    Types: Cigarettes   Smokeless tobacco: Never  Vaping Use   Vaping Use: Every day  Substance and Sexual Activity   Alcohol use: No   Drug use: No   Sexual activity: Not on file  Other Topics Concern   Not on file  Social History Narrative   Not on file   Social Determinants of Health   Financial Resource Strain: Not on file  Food Insecurity: Not on file  Transportation  Needs: Not on file  Physical Activity: Not on file  Stress: Not on file  Social Connections: Not on file   Additional Social History:                         Sleep: Fair  Appetite:  Fair  Current Medications: Current Facility-Administered Medications  Medication Dose Route Frequency Provider Last Rate Last Admin   alum & mag hydroxide-simeth (MAALOX/MYLANTA) 200-200-20 MG/5ML suspension 30 mL  30 mL Oral Q4H PRN [name], MD   30 mL at 01/24/21 1959   [START ON 01/27/2021] divalproex (DEPAKOTE) DR tablet 500 mg  500 mg Oral q AM [name], MD       divalproex (DEPAKOTE) DR tablet 750 mg  750 mg Oral QHS [name], MD       feeding supplement (ENSURE ENLIVE / ENSURE PLUS) liquid 237 mL  237 mL Oral TID BM [name], MD   237 mL at 01/26/21 1008   hydrOXYzine (ATARAX/VISTARIL) tablet 50 mg  50 mg Oral TID PRN [name], MD   50 mg at 01/26/21 0538   ibuprofen (ADVIL) tablet 600 mg  600 mg Oral Q6H PRN [name], MD   600 mg at 01/22/21 2138   magnesium hydroxide (MILK OF MAGNESIA) suspension 30 mL  30 mL Oral Daily PRN [name], MD       multivitamin with minerals tablet 1 tablet  1 tablet Oral Daily [name], MD   1 tablet at 01/26/21 0865   nicotine polacrilex (NICORETTE) gum 2 mg  2 mg Oral Q4H PRN [name], MD   2 mg at 01/26/21 0810   OLANZapine (ZYPREXA) tablet 10 mg  10 mg Oral Q6H PRN [name], MD   10 mg at 01/23/21 2018   OLANZapine zydis (ZYPREXA) disintegrating tablet 10 mg  10 mg Oral Daily [name], MD   10 mg at 01/26/21 0806   OLANZapine zydis (ZYPREXA) disintegrating tablet 20 mg  20 mg Oral QHS [name], MD   20 mg at 01/25/21 2113   traZODone (DESYREL) tablet 100 mg  100 mg Oral QHS PRN [name], MD   100 mg at 01/25/21 2113   ziprasidone (GEODON) injection 20 mg  20 mg Intramuscular Q6H PRN [name], MD        Lab Results:  Results for orders placed or  performed during the hospital encounter of 01/18/21 (from the past 48 hour(s))  Valproic acid level     Status: None   Collection Time: 01/25/21  5:54 PM  Result Value Ref Range   Valproic Acid Lvl 52 50.0 - 100.0 ug/mL    Comment: Performed at [facility], [address] [name], [place] [address]    Blood Alcohol level:  Lab Results  Component Value Date   ETH 12 (H) 01/17/2021    Metabolic Disorder Labs: Lab Results  Component Value Date   HGBA1C 5.3 01/19/2021   MPG 105.41 01/19/2021   No results found for: PROLACTIN Lab Results  Component Value Date   CHOL 155 01/19/2021   TRIG 95 01/19/2021   HDL 58 01/19/2021   CHOLHDL 2.7 01/19/2021   VLDL 19 01/19/2021   LDLCALC 78 01/19/2021    Physical Findings: AIMS:  , ,  ,  ,    CIWA:    COWS:     Musculoskeletal: Strength & Muscle Tone: within normal limits Gait & Station: normal Patient leans: N/A  Psychiatric Specialty Exam:  Presentation  General Appearance: Appropriate for Environment; Casual  Eye Contact:Fair  Speech:Pressured  Speech Volume:Normal  Handedness:Right   Mood and Affect  Mood:Irritable  Affect:Congruent   Thought Process  Thought Processes:Goal Directed  Descriptions of Associations:Intact  Orientation:Full (Time, Place and Person)  Thought Content:Rumination  History of Schizophrenia/Schizoaffective disorder:No  Duration of Psychotic Symptoms:Less than six months  Hallucinations:Hallucinations: None  Ideas of Reference:None  Suicidal Thoughts:Suicidal Thoughts: No  Homicidal Thoughts:Homicidal Thoughts: No   Sensorium  Memory:Immediate Fair; Recent Fair; Remote Fair  Judgment:Intact  Insight:Present   Executive Functions  Concentration:Fair  Attention Span:Fair  Recall:Fair  Fund of Knowledge:Fair  Language:Fair   Psychomotor Activity  Psychomotor Activity:Psychomotor Activity: Normal   Assets  Assets:Communication Skills;  Physical Health; Resilience; Social Support; Talents/Skills   Sleep  Sleep:Sleep: Fair Number of Hours of Sleep: 6    Physical Exam: Physical Exam ROS Blood pressure 134/78, pulse 96, temperature 97.9 F (36.6 C), temperature source Oral, resp. rate 18, height 5\' 8"  (1.727 m), weight 54.4 kg, SpO2 100 %. Body mass index is 18.25 kg/m.   Treatment Plan Summary: Daily contact with patient to assess and evaluate symptoms and progress in treatment and Medication management 20 year old male presenting for acute psychosis. Diagnosis Bipolar I Disorder, current episode manic with psychotic features. Continue Olanzapine 10 mg in the morning and 20 mg in the evening.  VPA acid level 52.  Increase Depakote to 500 mg in the morning, 750 mg in the evening.   Saksham Sans, MD 01/26/2021, 11:05 AM

## 2021-01-26 NOTE — Progress Notes (Signed)
Patient presents hyperactive, pacing the unit. Pt has rapid pressured speech, denies SI/HI/AVH. Pt observed interacting appropriately with staff and peers on the unit. Pt compliant with medication administration per MD orders. Pt given education, support, and encouragement to be active in his treatment plan. Pt being monitored Q 15 minutes for safety per unit protocol. Pt remains safe on the unit.  

## 2021-01-26 NOTE — Progress Notes (Signed)
Recreation Therapy Notes  Date: 01/26/2021  Time: 10:00 am   Location: Craft room   Behavioral response: Appropriate  Intervention Topic: Necessities   Discussion/Intervention:  Group content on today was focused on necessities. The group defined necessities and how they determine their necessities. Individuals expressed how many necessities they have and if it changes from day to day. Patients described the difference between wants and needs. The group explained how they have overspent on wants in the past. Individuals described a reoccurring necessity for them. The intervention "What I need" helped patients differentiate between wants and needs.  Clinical Observations/Feedback: Patient came to group and expressed that his necessities are transportation, pizza, knowledge and clothes. Individual was social with peers and staff while participating in the intervention.  Bryant Saye LRT/CTRS          [place] 01/26/2021 11:58 AM

## 2021-01-26 NOTE — Progress Notes (Signed)
D: Pt alert and oriented. Pt rates depression 0/10, hopelessness 0/10, and anxiety 0/10. Pt goal: "Keep moving forward." Pt reports energy level as normal and concentration as being good. Pt reports sleep last night as being good. Pt did receive medications for sleep and did find them helpful. Pt denies experiencing any pain at this time. Pt denies experiencing any SI/HI, or AVH at this time.   Pt continues to pace hallways and appears as anxious/irritable.Pt's mood is labile throughout day going from laughing/childish to anxious/irritable. Pt has received prn meds which appear to help a little with mood.  A: Scheduled medications administered to pt, per MD orders. Support and encouragement provided. Frequent verbal contact made. Routine safety checks conducted q15 minutes.   R: No adverse drug reactions noted. Pt verbally contracts for safety at this time. Pt complaint with medications and treatment plan. Pt interacts well with others on the unit. Pt remains safe at this time. Will continue to monitor.

## 2021-01-27 NOTE — Group Note (Signed)
[facility] LCSW Group Therapy Note   Group Date: 01/27/2021 Start Time: 1300 End Time: 1400  Type of Therapy and Topic:  Group Therapy:  Feelings around Relapse and Recovery  Participation Level:  Did Not Attend   Mood:  Description of Group:    Patients in this group will discuss emotions they experience before and after a relapse. They will process how experiencing these feelings, or avoidance of experiencing them, relates to having a relapse. Facilitator will guide patients to explore emotions they have related to recovery. Patients will be encouraged to process which emotions are more powerful. They will be guided to discuss the emotional reaction significant others in their lives may have to patients' relapse or recovery. Patients will be assisted in exploring ways to respond to the emotions of others without this contributing to a relapse.  Therapeutic Goals: Patient will identify two or more emotions that lead to relapse for them:  Patient will identify two emotions that result when they relapse:  Patient will identify two emotions related to recovery:  Patient will demonstrate ability to communicate their needs through discussion and/or role plays.   Summary of Patient Progress: Patient did not attend group despite encouraged participation.    Therapeutic Modalities:   Cognitive Behavioral Therapy Solution-Focused Therapy Assertiveness Training Relapse Prevention Therapy   Kimra Kantor W Rebecka Oelkers, LCSWA 

## 2021-01-27 NOTE — Progress Notes (Signed)
D: Pt alert and oriented. Pt rates depression 0/10, hopelessness 0/10, and anxiety 0/10. Pt goal: "Keep progressing in a straight path to success." Pt reports energy level as normal and concentration as being good. Pt reports sleep last night as being good. Pt did receive medications for sleep and did find them helpful. Pt denies experiencing any pain at this time. Pt denies experiencing any SI/HI, or AVH at this time.   Pt's mood is labile observed as childish and silly to irritable and sullen. Pt continues to pace the hall and is restless. Pt can be redirected and de-escalated.   A: Scheduled medications administered to pt, per MD orders. Support and encouragement provided. Frequent verbal contact made. Routine safety checks conducted q15 minutes.   R: No adverse drug reactions noted. Pt verbally contracts for safety at this time. Pt complaint with medications and treatment plan. Pt interacts well with others on the unit. Pt remains safe at this time. Will continue to monitor.

## 2021-01-27 NOTE — Progress Notes (Signed)
[facility] MD Progress Note  01/27/2021 5:54 PM Noah [name]  MRN:  [id number] Subjective: Follow-up for this 20 year old man presented to the hospital with manic like psychosis.  On interview today the patient was pressured and rapid in his speech.  Not obviously disorganized.  Talking about his desire to go home.  Still seems a little confused about what brought him into the hospital and where he will be going after he leaves.  Denies suicidal ideation.  Denies hallucinations. Principal Problem: Acute psychosis (HCC) Diagnosis: Principal Problem:   Acute psychosis (HCC) Active Problems:   Polysubstance abuse (HCC)  Total Time spent with patient: 30 minutes  Past Psychiatric History: See previous  Past Medical History: History reviewed. No pertinent past medical history. History reviewed. No pertinent surgical history. Family History: History reviewed. No pertinent family history. Family Psychiatric  History: See previous Social History:  Social History   Substance and Sexual Activity  Alcohol Use No     Social History   Substance and Sexual Activity  Drug Use No    Social History   Socioeconomic History   Marital status: Single    Spouse name: Not on file   Number of children: Not on file   Years of education: Not on file   Highest education level: Not on file  Occupational History   Not on file  Tobacco Use   Smoking status: Some Days    Types: Cigarettes   Smokeless tobacco: Never  Vaping Use   Vaping Use: Every day  Substance and Sexual Activity   Alcohol use: No   Drug use: No   Sexual activity: Not on file  Other Topics Concern   Not on file  Social History Narrative   Not on file   Social Determinants of Health   Financial Resource Strain: Not on file  Food Insecurity: Not on file  Transportation Needs: Not on file  Physical Activity: Not on file  Stress: Not on file  Social Connections: Not on file   Additional Social History:                          Sleep: Fair  Appetite:  Fair  Current Medications: Current Facility-Administered Medications  Medication Dose Route Frequency Provider Last Rate Last Admin   alum & mag hydroxide-simeth (MAALOX/MYLANTA) 200-200-20 MG/5ML suspension 30 mL  30 mL Oral Q4H PRN Willow Sans, MD   30 mL at 01/24/21 1959   divalproex (DEPAKOTE) DR tablet 500 mg  500 mg Oral q AM Zeeshan Sans, MD   500 mg at 01/27/21 0630   divalproex (DEPAKOTE) DR tablet 750 mg  750 mg Oral QHS Mitcheal Sans, MD   750 mg at 01/26/21 2105   feeding supplement (ENSURE ENLIVE / ENSURE PLUS) liquid 237 mL  237 mL Oral TID BM Darryl Sans, MD   237 mL at 01/27/21 1422   hydrOXYzine (ATARAX/VISTARIL) tablet 50 mg  50 mg Oral TID PRN Raza Sans, MD   50 mg at 01/27/21 1652   ibuprofen (ADVIL) tablet 600 mg  600 mg Oral Q6H PRN Reice Sans, MD   600 mg at 01/22/21 2138   magnesium hydroxide (MILK OF MAGNESIA) suspension 30 mL  30 mL Oral Daily PRN Trino Sans, MD       multivitamin with minerals tablet 1 tablet  1 tablet Oral Daily Darcel Sans, MD   1 tablet at 01/27/21 [id number]  nicotine polacrilex (NICORETTE) gum 2 mg  2 mg Oral Q4H PRN Tagen Sans, MD   2 mg at 01/27/21 1652   OLANZapine (ZYPREXA) tablet 10 mg  10 mg Oral Q6H PRN Horacio Sans, MD   10 mg at 01/27/21 0958   OLANZapine zydis (ZYPREXA) disintegrating tablet 10 mg  10 mg Oral Daily Tayte Sans, MD   10 mg at 01/27/21 0723   OLANZapine zydis (ZYPREXA) disintegrating tablet 20 mg  20 mg Oral QHS Zaylon Sans, MD   20 mg at 01/26/21 2105   traZODone (DESYREL) tablet 100 mg  100 mg Oral QHS PRN Shahiem Sans, MD   100 mg at 01/26/21 2107   ziprasidone (GEODON) injection 20 mg  20 mg Intramuscular Q6H PRN Raykwon Sans, MD        Lab Results: No results found for this or any previous visit (from the past 48 hour(s)).  Blood Alcohol level:  Lab Results  Component Value Date   ETH 12 (H) 01/17/2021     Metabolic Disorder Labs: Lab Results  Component Value Date   HGBA1C 5.3 01/19/2021   MPG 105.41 01/19/2021   No results found for: PROLACTIN Lab Results  Component Value Date   CHOL 155 01/19/2021   TRIG 95 01/19/2021   HDL 58 01/19/2021   CHOLHDL 2.7 01/19/2021   VLDL 19 01/19/2021   LDLCALC 78 01/19/2021    Physical Findings: AIMS:  , ,  ,  ,    CIWA:    COWS:     Musculoskeletal: Strength & Muscle Tone: within normal limits Gait & Station: normal Patient leans: N/A  Psychiatric Specialty Exam:  Presentation  General Appearance: Appropriate for Environment; Casual  Eye Contact:Fair  Speech:Pressured  Speech Volume:Normal  Handedness:Right   Mood and Affect  Mood:Irritable  Affect:Congruent   Thought Process  Thought Processes:Goal Directed  Descriptions of Associations:Intact  Orientation:Full (Time, Place and Person)  Thought Content:Rumination  History of Schizophrenia/Schizoaffective disorder:No  Duration of Psychotic Symptoms:Less than six months  Hallucinations:No data recorded Ideas of Reference:None  Suicidal Thoughts:No data recorded Homicidal Thoughts:No data recorded  Sensorium  Memory:Immediate Fair; Recent Fair; Remote Fair  Judgment:Intact  Insight:Present   Executive Functions  Concentration:Fair  Attention Span:Fair  Recall:Fair  Fund of Knowledge:Fair  Language:Fair   Psychomotor Activity  Psychomotor Activity: No data recorded  Assets  Assets:Communication Skills; Physical Health; Resilience; Social Support; Talents/Skills   Sleep  Sleep: No data recorded   Physical Exam: Physical Exam Vitals and nursing note reviewed.  Constitutional:      Appearance: Normal appearance.  HENT:     Head: Normocephalic and atraumatic.     Mouth/Throat:     Pharynx: Oropharynx is clear.  Eyes:     Pupils: Pupils are equal, round, and reactive to light.  Cardiovascular:     Rate and Rhythm: Normal rate  and regular rhythm.  Pulmonary:     Effort: Pulmonary effort is normal.     Breath sounds: Normal breath sounds.  Abdominal:     General: Abdomen is flat.     Palpations: Abdomen is soft.  Musculoskeletal:        General: Normal range of motion.  Skin:    General: Skin is warm and dry.  Neurological:     General: No focal deficit present.     Mental Status: He is alert. Mental status is at baseline.  Psychiatric:        Attention and Perception: Attention  normal.        Mood and Affect: Mood normal. Affect is labile.        Speech: Speech is rapid and pressured.        Behavior: Behavior is agitated. Behavior is not aggressive.        Thought Content: Thought content normal.        Cognition and Memory: Cognition is impaired.        Judgment: Judgment is impulsive.   Review of Systems  Constitutional: Negative.   HENT: Negative.    Eyes: Negative.   Respiratory: Negative.    Cardiovascular: Negative.   Gastrointestinal: Negative.   Musculoskeletal: Negative.   Skin: Negative.   Neurological: Negative.   Psychiatric/Behavioral:  Negative for depression, hallucinations, substance abuse and suicidal ideas. The patient is nervous/anxious.   Blood pressure (!) 141/89, pulse (!) 103, temperature 98.4 F (36.9 C), temperature source Oral, resp. rate 17, height 5\' 8"  (1.727 m), weight 54.4 kg, SpO2 100 %. Body mass index is 18.25 kg/m.   Treatment Plan Summary: Plan patient continues to have pressured speech disorganized thinking some lack of reality testing.  Continue current olanzapine.  Encouragement and supportive counseling.  Possible discharge sometime into early next week.  , MD 01/27/2021, 5:54 PM

## 2021-01-27 NOTE — Progress Notes (Signed)
Recreation Therapy Notes   Date: 01/27/2021  Time: 10:00 am   Location: Craft room    Behavioral response: N/A   Intervention Topic: Stress Management     Discussion/Intervention: Patient did not attend group.   Clinical Observations/Feedback:  Patient did not attend group.   Cadyn Fann LRT/CTRS        Izekiel Flegel 01/27/2021 12:27 PM

## 2021-01-28 NOTE — Progress Notes (Signed)
[facility] MD Progress Note  01/28/2021 2:25 PM Noah [name]  MRN:  [id number] Subjective: Follow-up for this 20 year old who has been seeming to have manic-like symptoms.  Today I noticed that he was pacing a lot on the ward.  He came up to me and asked me if he could should be transferred to a larger facility so he could have more room to walk.  I tried to ask him if he was having a sensation like he could not sit still but he gave me some answer like how he liked to exercise.  Nevertheless he was so determined to pace he would not standstill to talk to me in the hallway.  He is only on olanzapine which is not the most notorious medicine for akathisia but he has been on 30 mg of it.  We have not gotten a Depakote level in a couple days. Principal Problem: Acute psychosis (HCC) Diagnosis: Principal Problem:   Acute psychosis (HCC) Active Problems:   Polysubstance abuse (HCC)  Total Time spent with patient: 30 minutes  Past Psychiatric History: Past history of psychotic disorder and substance abuse  Past Medical History: History reviewed. No pertinent past medical history. History reviewed. No pertinent surgical history. Family History: History reviewed. No pertinent family history. Family Psychiatric  History: See previous Social History:  Social History   Substance and Sexual Activity  Alcohol Use No     Social History   Substance and Sexual Activity  Drug Use No    Social History   Socioeconomic History   Marital status: Single    Spouse name: Not on file   Number of children: Not on file   Years of education: Not on file   Highest education level: Not on file  Occupational History   Not on file  Tobacco Use   Smoking status: Some Days    Types: Cigarettes   Smokeless tobacco: Never  Vaping Use   Vaping Use: Every day  Substance and Sexual Activity   Alcohol use: No   Drug use: No   Sexual activity: Not on file  Other Topics Concern   Not on file  Social History  Narrative   Not on file   Social Determinants of Health   Financial Resource Strain: Not on file  Food Insecurity: Not on file  Transportation Needs: Not on file  Physical Activity: Not on file  Stress: Not on file  Social Connections: Not on file   Additional Social History:                         Sleep: Fair  Appetite:  Fair  Current Medications: Current Facility-Administered Medications  Medication Dose Route Frequency Provider Last Rate Last Admin   alum & mag hydroxide-simeth (MAALOX/MYLANTA) 200-200-20 MG/5ML suspension 30 mL  30 mL Oral Q4H PRN Nyron Sans, MD   30 mL at 01/24/21 1959   divalproex (DEPAKOTE) DR tablet 500 mg  500 mg Oral q AM Delsin Sans, MD   500 mg at 01/28/21 0827   divalproex (DEPAKOTE) DR tablet 750 mg  750 mg Oral QHS Graylon Sans, MD   750 mg at 01/27/21 2104   feeding supplement (ENSURE ENLIVE / ENSURE PLUS) liquid 237 mL  237 mL Oral TID BM Talik Sans, MD   237 mL at 01/28/21 0825   hydrOXYzine (ATARAX/VISTARIL) tablet 50 mg  50 mg Oral TID PRN Jamai Sans, MD   50 mg  at 01/28/21 0626   ibuprofen (ADVIL) tablet 600 mg  600 mg Oral Q6H PRN Jedidiah Sans, MD   600 mg at 01/22/21 2138   magnesium hydroxide (MILK OF MAGNESIA) suspension 30 mL  30 mL Oral Daily PRN Keelin Sans, MD       multivitamin with minerals tablet 1 tablet  1 tablet Oral Daily Jarnell Sans, MD   1 tablet at 01/28/21 2992   nicotine polacrilex (NICORETTE) gum 2 mg  2 mg Oral Q4H PRN Domani Sans, MD   2 mg at 01/28/21 0627   OLANZapine (ZYPREXA) tablet 10 mg  10 mg Oral Q6H PRN Jheremy Sans, MD   10 mg at 01/27/21 0958   OLANZapine zydis (ZYPREXA) disintegrating tablet 20 mg  20 mg Oral QHS Gaelan Sans, MD   20 mg at 01/27/21 2103   traZODone (DESYREL) tablet 100 mg  100 mg Oral QHS PRN Chavez Sans, MD   100 mg at 01/27/21 2203   ziprasidone (GEODON) injection 20 mg  20 mg Intramuscular Q6H PRN Ondra Sans, MD         Lab Results: No results found for this or any previous visit (from the past 48 hour(s)).  Blood Alcohol level:  Lab Results  Component Value Date   ETH 12 (H) 01/17/2021    Metabolic Disorder Labs: Lab Results  Component Value Date   HGBA1C 5.3 01/19/2021   MPG 105.41 01/19/2021   No results found for: PROLACTIN Lab Results  Component Value Date   CHOL 155 01/19/2021   TRIG 95 01/19/2021   HDL 58 01/19/2021   CHOLHDL 2.7 01/19/2021   VLDL 19 01/19/2021   LDLCALC 78 01/19/2021    Physical Findings: AIMS:  , ,  ,  ,    CIWA:    COWS:     Musculoskeletal: Strength & Muscle Tone: within normal limits Gait & Station: normal Patient leans: N/A  Psychiatric Specialty Exam:  Presentation  General Appearance: Appropriate for Environment; Casual  Eye Contact:Fair  Speech:Pressured  Speech Volume:Normal  Handedness:Right   Mood and Affect  Mood:Irritable  Affect:Congruent   Thought Process  Thought Processes:Goal Directed  Descriptions of Associations:Intact  Orientation:Full (Time, Place and Person)  Thought Content:Rumination  History of Schizophrenia/Schizoaffective disorder:No  Duration of Psychotic Symptoms:Less than six months  Hallucinations:No data recorded Ideas of Reference:None  Suicidal Thoughts:No data recorded Homicidal Thoughts:No data recorded  Sensorium  Memory:Immediate Fair; Recent Fair; Remote Fair  Judgment:Intact  Insight:Present   Executive Functions  Concentration:Fair  Attention Span:Fair  Recall:Fair  Fund of Knowledge:Fair  Language:Fair   Psychomotor Activity  Psychomotor Activity: No data recorded  Assets  Assets:Communication Skills; Physical Health; Resilience; Social Support; Talents/Skills   Sleep  Sleep: No data recorded   Physical Exam: Physical Exam Vitals and nursing note reviewed.  Constitutional:      Appearance: Normal appearance.  HENT:     Head: Normocephalic and  atraumatic.     Mouth/Throat:     Pharynx: Oropharynx is clear.  Eyes:     Pupils: Pupils are equal, round, and reactive to light.  Cardiovascular:     Rate and Rhythm: Normal rate and regular rhythm.  Pulmonary:     Effort: Pulmonary effort is normal.     Breath sounds: Normal breath sounds.  Abdominal:     General: Abdomen is flat.     Palpations: Abdomen is soft.  Musculoskeletal:        General: Normal  range of motion.  Skin:    General: Skin is warm and dry.  Neurological:     General: No focal deficit present.     Mental Status: He is alert. Mental status is at baseline.  Psychiatric:        Attention and Perception: He is inattentive.        Mood and Affect: Mood is anxious.        Speech: Speech is rapid and pressured.        Behavior: Behavior is agitated. Behavior is not aggressive.        Thought Content: Thought content normal.        Cognition and Memory: Cognition is impaired.        Judgment: Judgment is impulsive.   Review of Systems  Constitutional: Negative.   HENT: Negative.    Eyes: Negative.   Respiratory: Negative.    Cardiovascular: Negative.   Gastrointestinal: Negative.   Musculoskeletal: Negative.   Skin: Negative.   Neurological: Negative.   Psychiatric/Behavioral:  Negative for depression and suicidal ideas. The patient is nervous/anxious.   Blood pressure 124/88, pulse (!) 102, temperature 98 F (36.7 C), temperature source Oral, resp. rate 18, height 5\' 8"  (1.727 m), weight 54.4 kg, SpO2 99 %. Body mass index is 18.25 kg/m.   Treatment Plan Summary: Medication management and Plan I am a little on the fence about whether to try cutting back his antipsychotic because of what looks a lot like akathisia or whether we should just continue with this course presuming it is still the manic symptoms.  He has not hurt anybody and has not been aggressive.  He is insight still seems pretty limited.  I am going to try at least for the next day cutting  out his morning olanzapine and see if it makes any difference at all tomorrow.  We need to get another Depakote level which we will probably do Monday morning.  Wednesday, MD 01/28/2021, 2:25 PM

## 2021-01-28 NOTE — Group Note (Signed)
[facility] LCSW Group Therapy Note   Group Date: 01/28/2021 Start Time: 1300 End Time: 1400   Type of Therapy and Topic: Group Therapy: Avoiding Self-Sabotaging and Enabling Behaviors  Participation Level: Active  Mood: Anxious with pressured speech  Description of Group:  In this group, patients will learn how to identify obstacles, self-sabotaging and enabling behaviors, as well as: what are they, why do we do them and what needs these behaviors meet. Discuss unhealthy relationships and how to have positive healthy boundaries with those that sabotage and enable. Explore aspects of self-sabotage and enabling in yourself and how to limit these self-destructive behaviors in everyday life.   Therapeutic Goals: 1. Patient will identify one obstacle that relates to self-sabotage and enabling behaviors 2. Patient will identify one personal self-sabotaging or enabling behavior they did prior to admission 3. Patient will state a plan to change the above identified behavior 4. Patient will demonstrate ability to communicate their needs through discussion and/or role play.    Summary of Patient Progress: Patient was present for the entirety of group and engaged in discussion relevant to the group topic. Patient ventured off topic at times but was easily redirected. Patient identified picking fights as a self-sabotaging behavior he has engaged in. Patient participated in discussion about the influence others can have on behavior. Patient identified his uncle and his grandparents as positive influences. Patient stated that the sleep medication he has been taking while hospitalized has fixed his sleeping issues and stated that he usually smokes marijuana to fall asleep. Patient identified substance abuse as a self-sabotaging behavior but stated that marijuana helps him. Patient identified listening to calming music as an additional coping strategy to aid him in sleep.     Therapeutic Modalities:  Cognitive  Behavioral Therapy Person-Centered Therapy Motivational Interviewing    [name], Theresia Majors 01/28/2021 4:25PM

## 2021-01-28 NOTE — Progress Notes (Signed)
Awake, alert, and oriented x 4. No acute distress. Client paces the hallway frequently, but no signs of aggression at this time. He is pleasant during interaction with staff and peers. Denies SI/HI, A/VH. Remains safe on the unit with q15 min safety checks.

## 2021-01-28 NOTE — Progress Notes (Signed)
Patient compliant with medications and treatment Denies SI/HI/A/VH and verbally contracted for safety. Support and encouragement provided as needed.   01/28/21 1500  Psych Admission Type (Psych Patients Only)  Admission Status Involuntary  Psychosocial Assessment  Patient Complaints None  Eye Contact Fair  Facial Expression Animated;Sullen  Affect Labile;Silly;Irritable  Speech Rapid;Pressured;Tangential  Interaction Childlike;Defensive  Motor Activity Restless;Hyperactive;Pacing  Appearance/Hygiene Unremarkable;In scrubs  Behavior Characteristics Hyperactive  Mood Labile  Thought Process  Coherency Concrete thinking  Content Preoccupation  Delusions None reported or observed  Perception WDL  Hallucination None reported or observed  Judgment Limited  Confusion None  Danger to Self  Current suicidal ideation? Denies  Danger to Others  Danger to Others None reported or observed

## 2021-01-28 NOTE — Progress Notes (Signed)
Patient alert and oriented. Patient interactive with staff and peers. Patient continues to pace the halls. Patient denies SI, HI, or AVH. Patient was compliant with medications.  Routine safety checks q15 minutes maintained.

## 2021-01-29 NOTE — Progress Notes (Signed)
[name] MD Progress Note  01/29/2021 11:38 AM Noah [name]  MRN:  [id number] Subjective: Follow-up for this 19 year old man presented to the hospital with psychosis.  Patient seen chart reviewed.  At his request I also called the number of his family and spoke to his uncle who appeared to be the best [place] speaker available.  Patient says he is feeling better.  He is certainly much better organized than when he first came into the hospital.  He does not really look like he is having hallucinations.  He is able to have up lucid back-and-forth conversation.  Still some unrealistic plans for the future but not as grossly bizarre as when he came in.  He has been taking his medicine and tolerating it.  I spoke with his uncle who confirmed that for the last month or 2 he had been having more mental health issues and that the family felt that it was not related to substance abuse Principal Problem: Acute psychosis (HCC) Diagnosis: Principal Problem:   Acute psychosis (HCC) Active Problems:   Polysubstance abuse (HCC)  Total Time spent with patient: 30 minutes  Past Psychiatric History: Sounds like symptoms have been coming on for a couple months but no previous hospitalization confirmed no previous medicine.  Past Medical History: History reviewed. No pertinent past medical history. History reviewed. No pertinent surgical history. Family History: History reviewed. No pertinent family history. Family Psychiatric  History: None reported Social History:  Social History   Substance and Sexual Activity  Alcohol Use No     Social History   Substance and Sexual Activity  Drug Use No    Social History   Socioeconomic History   Marital status: Single    Spouse name: Not on file   Number of children: Not on file   Years of education: Not on file   Highest education level: Not on file  Occupational History   Not on file  Tobacco Use   Smoking status: Some Days    Types: Cigarettes    Smokeless tobacco: Never  Vaping Use   Vaping Use: Every day  Substance and Sexual Activity   Alcohol use: No   Drug use: No   Sexual activity: Not on file  Other Topics Concern   Not on file  Social History Narrative   Not on file   Social Determinants of Health   Financial Resource Strain: Not on file  Food Insecurity: Not on file  Transportation Needs: Not on file  Physical Activity: Not on file  Stress: Not on file  Social Connections: Not on file   Additional Social History:                         Sleep: Fair  Appetite:  Fair  Current Medications: Current Facility-Administered Medications  Medication Dose Route Frequency Provider Last Rate Last Admin   alum & mag hydroxide-simeth (MAALOX/MYLANTA) 200-200-20 MG/5ML suspension 30 mL  30 mL Oral Q4H PRN Ras Sans, MD   30 mL at 01/24/21 1959   divalproex (DEPAKOTE) DR tablet 500 mg  500 mg Oral q AM Lejend Sans, MD   500 mg at 01/29/21 0800   divalproex (DEPAKOTE) DR tablet 750 mg  750 mg Oral QHS Faye Sans, MD   750 mg at 01/28/21 2109   feeding supplement (ENSURE ENLIVE / ENSURE PLUS) liquid 237 mL  237 mL Oral TID BM Rilen Sans, MD   237 mL at  01/28/21 1941   hydrOXYzine (ATARAX/VISTARIL) tablet 50 mg  50 mg Oral TID PRN Marjorie Sans, MD   50 mg at 01/29/21 0817   ibuprofen (ADVIL) tablet 600 mg  600 mg Oral Q6H PRN Idrissa Sans, MD   600 mg at 01/28/21 2109   magnesium hydroxide (MILK OF MAGNESIA) suspension 30 mL  30 mL Oral Daily PRN Daylen Sans, MD       multivitamin with minerals tablet 1 tablet  1 tablet Oral Daily Jameal Sans, MD   1 tablet at 01/29/21 0816   nicotine polacrilex (NICORETTE) gum 2 mg  2 mg Oral Q4H PRN Jamesyn Sans, MD   2 mg at 01/29/21 0816   OLANZapine (ZYPREXA) tablet 10 mg  10 mg Oral Q6H PRN Aylen Sans, MD   10 mg at 01/27/21 0958   OLANZapine zydis (ZYPREXA) disintegrating tablet 20 mg  20 mg Oral QHS Arvon Sans, MD   20  mg at 01/28/21 2218   traZODone (DESYREL) tablet 100 mg  100 mg Oral QHS PRN Zacharias Sans, MD   100 mg at 01/28/21 2108   ziprasidone (GEODON) injection 20 mg  20 mg Intramuscular Q6H PRN Jamas Sans, MD        Lab Results: No results found for this or any previous visit (from the past 48 hour(s)).  Blood Alcohol level:  Lab Results  Component Value Date   ETH 12 (H) 01/17/2021    Metabolic Disorder Labs: Lab Results  Component Value Date   HGBA1C 5.3 01/19/2021   MPG 105.41 01/19/2021   No results found for: PROLACTIN Lab Results  Component Value Date   CHOL 155 01/19/2021   TRIG 95 01/19/2021   HDL 58 01/19/2021   CHOLHDL 2.7 01/19/2021   VLDL 19 01/19/2021   LDLCALC 78 01/19/2021    Physical Findings: AIMS:  , ,  ,  ,    CIWA:    COWS:     Musculoskeletal: Strength & Muscle Tone: within normal limits Gait & Station: normal Patient leans: N/A  Psychiatric Specialty Exam:  Presentation  General Appearance: Appropriate for Environment; Casual  Eye Contact:Fair  Speech:Pressured  Speech Volume:Normal  Handedness:Right   Mood and Affect  Mood:Irritable  Affect:Congruent   Thought Process  Thought Processes:Goal Directed  Descriptions of Associations:Intact  Orientation:Full (Time, Place and Person)  Thought Content:Rumination  History of Schizophrenia/Schizoaffective disorder:No  Duration of Psychotic Symptoms:Less than six months  Hallucinations:No data recorded Ideas of Reference:None  Suicidal Thoughts:No data recorded Homicidal Thoughts:No data recorded  Sensorium  Memory:Immediate Fair; Recent Fair; Remote Fair  Judgment:Intact  Insight:Present   Executive Functions  Concentration:Fair  Attention Span:Fair  Recall:Fair  Fund of Knowledge:Fair  Language:Fair   Psychomotor Activity  Psychomotor Activity: No data recorded  Assets  Assets:Communication Skills; Physical Health; Resilience; Social Support;  Talents/Skills   Sleep  Sleep: No data recorded   Physical Exam: Physical Exam Vitals and nursing note reviewed.  Constitutional:      Appearance: Normal appearance.  HENT:     Head: Normocephalic and atraumatic.     Mouth/Throat:     Pharynx: Oropharynx is clear.  Eyes:     Pupils: Pupils are equal, round, and reactive to light.  Cardiovascular:     Rate and Rhythm: Normal rate and regular rhythm.  Pulmonary:     Effort: Pulmonary effort is normal.     Breath sounds: Normal breath sounds.  Abdominal:  General: Abdomen is flat.     Palpations: Abdomen is soft.  Musculoskeletal:        General: Normal range of motion.  Skin:    General: Skin is warm and dry.  Neurological:     General: No focal deficit present.     Mental Status: He is alert. Mental status is at baseline.  Psychiatric:        Attention and Perception: Attention normal.        Mood and Affect: Mood is anxious.        Speech: Speech is rapid and pressured.        Behavior: Behavior is cooperative.        Thought Content: Thought content normal.        Cognition and Memory: Memory is impaired.        Judgment: Judgment is impulsive.   Review of Systems  Constitutional: Negative.   HENT: Negative.    Eyes: Negative.   Respiratory: Negative.    Cardiovascular: Negative.   Gastrointestinal: Negative.   Musculoskeletal: Negative.   Skin: Negative.   Neurological: Negative.   Psychiatric/Behavioral: Negative.    Blood pressure 134/85, pulse 87, temperature (!) 97.4 F (36.3 C), temperature source Oral, resp. rate 18, height 5\' 8"  (1.727 m), weight 54.4 kg, SpO2 100 %. Body mass index is 18.25 kg/m.   Treatment Plan Summary: Medication management and Plan he is doing much better.  Some hypomanic symptoms still visible but no longer appears to be psychotic.  Plan is to check a Depakote level tomorrow morning.  Continue psychoeducation.  Work on confirming follow-up treatment in [place].   Likely discharge probably 2 to 3 days.  [place] charles, MD 01/29/2021, 11:38 AM

## 2021-01-29 NOTE — Progress Notes (Signed)
Patient c/o anxiety 7/10 at 0817, PRN Vistaril 50 mg PO given and reported effective at 0900. Patient compliant with treatment. Pacing the hallways most of the day. Attended scheduled programming and interacting well with Peers and staff. Denies SI/HI/A/VH and verbally contracted for safety. Q 15 minutes safety checks ongoing without self harm gestures. Will continue to monitor.

## 2021-01-29 NOTE — Group Note (Signed)
LCSW Group Therapy Note  Group Date: 01/29/2021 Start Time: 1300 End Time: 1400   Type of Therapy and Topic:  Group Therapy - How To Cope with Nervousness about Discharge   Participation Level:  Active   Description of Group This process group involved identification of patients' feelings about discharge. Some of them are scheduled to be discharged soon, while others are new admissions, but each of them was asked to share thoughts and feelings surrounding discharge from the hospital. One common theme was that they are excited at the prospect of going home, while another was that many of them are apprehensive about sharing why they were hospitalized. Patients were given the opportunity to discuss these feelings with their peers in preparation for discharge.  Therapeutic Goals  Patient will identify their overall feelings about pending discharge. Patient will think about how they might proactively address issues that they believe will once again arise once they get home (i.e. with parents). Patients will participate in discussion about having hope for change.   Summary of Patient Progress:  Patient was present and participated for the duration of the group. Patient discussed his discharge plans, including living with his grandmother and is hoping to have medication brought to the home. Patient stated he is unsure where he will receive mental health services after discharge but shared his grandmother will help him set up psychiatric medication management post hospitalization. 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    Therapeutic Modalities Cognitive Behavioral Therapy   Noah [name], [facility] [name] 01/29/2021  2:54 PM

## 2021-01-30 LAB — VALPROIC ACID LEVEL: Valproic Acid Lvl: 72 ug/mL (ref 50.0–100.0)

## 2021-01-30 MED ORDER — DIVALPROEX SODIUM 500 MG PO DR TAB
1000.0000 mg | DELAYED_RELEASE_TABLET | Freq: Every day | ORAL | Status: DC
  Administered 2021-01-30 – 2021-01-31 (×2): 1000 mg via ORAL
  Filled 2021-01-30 (×2): qty 2

## 2021-01-30 NOTE — Progress Notes (Signed)
Patient stated that he was irritable this morning because he accidentally spit out his nicotine gum. States " now I am using my coping skills like deep breathing." Patient continues to have pressured speech. Patient rated his anxiety and depression 0/10. Denies SI,HI and AVH. No aggressive behaviors noted. Appetite and energy level good. Support and encouragement given.

## 2021-01-30 NOTE — Plan of Care (Signed)

## 2021-01-30 NOTE — BH IP Treatment Plan (Signed)
Interdisciplinary Treatment and Diagnostic Plan Update  01/30/2021 Time of Session: 8:30AM Noah [name] MRN: [id number]  Principal Diagnosis: Acute psychosis [name])  Secondary Diagnoses: Principal Problem:   Acute psychosis (HCC) Active Problems:   Polysubstance abuse (HCC)   Current Medications:  Current Facility-Administered Medications  Medication Dose Route Frequency Provider Last Rate Last Admin   alum & mag hydroxide-simeth (MAALOX/MYLANTA) 200-200-20 MG/5ML suspension 30 mL  30 mL Oral Q4H PRN Adarrius Sans, MD   30 mL at 01/24/21 1959   divalproex (DEPAKOTE) DR tablet 500 mg  500 mg Oral q AM Javares Sans, MD   500 mg at 01/30/21 0754   divalproex (DEPAKOTE) DR tablet 750 mg  750 mg Oral QHS Moroni Sans, MD   750 mg at 01/29/21 2139   feeding supplement (ENSURE ENLIVE / ENSURE PLUS) liquid 237 mL  237 mL Oral TID BM Brandin Sans, MD   237 mL at 01/29/21 2035   hydrOXYzine (ATARAX/VISTARIL) tablet 50 mg  50 mg Oral TID PRN Mikhai Sans, MD   50 mg at 01/30/21 0331   ibuprofen (ADVIL) tablet 600 mg  600 mg Oral Q6H PRN Johnhenry Sans, MD   600 mg at 01/28/21 2109   magnesium hydroxide (MILK OF MAGNESIA) suspension 30 mL  30 mL Oral Daily PRN Ignatius Sans, MD       multivitamin with minerals tablet 1 tablet  1 tablet Oral Daily Vikash Sans, MD   1 tablet at 01/30/21 0754   nicotine polacrilex (NICORETTE) gum 2 mg  2 mg Oral Q4H PRN Emersen Sans, MD   2 mg at 01/30/21 0613   OLANZapine (ZYPREXA) tablet 10 mg  10 mg Oral Q6H PRN Jerman Sans, MD   10 mg at 01/30/21 0331   OLANZapine zydis (ZYPREXA) disintegrating tablet 20 mg  20 mg Oral QHS Rinaldo Sans, MD   20 mg at 01/29/21 2139   traZODone (DESYREL) tablet 100 mg  100 mg Oral QHS PRN Evyn Sans, MD   100 mg at 01/29/21 2138   ziprasidone (GEODON) injection 20 mg  20 mg Intramuscular Q6H PRN Yahel Sans, MD       PTA Medications: Medications Prior to Admission  Medication  Sig Dispense Refill Last Dose   ibuprofen (ADVIL,MOTRIN) 600 MG tablet Take 1 tablet (600 mg total) by mouth every 6 (six) hours as needed. (Patient not taking: No sig reported) 30 tablet 0     Patient Stressors: Medication change or noncompliance   Substance abuse    Patient Strengths: Ability for insight  Communication skills  Physical Health  Supportive family/friends   Treatment Modalities: Medication Management, Group therapy, Case management,  1 to 1 session with clinician, Psychoeducation, Recreational therapy.   Physician Treatment Plan for Primary Diagnosis: Acute psychosis (HCC) Long Term Goal(s): Improvement in symptoms so as ready for discharge   Short Term Goals: Ability to identify changes in lifestyle to reduce recurrence of condition will improve Ability to identify triggers associated with substance abuse/mental health issues will improve Ability to verbalize feelings will improve Ability to disclose and discuss suicidal ideas Ability to demonstrate self-control will improve Ability to identify and develop effective coping behaviors will improve Ability to maintain clinical measurements within normal limits will improve Compliance with prescribed medications will improve  Medication Management: Evaluate patient's response, side effects, and tolerance of medication regimen.  Therapeutic Interventions: 1 to 1 sessions, Unit Group sessions and Medication administration.  Evaluation of Outcomes: Progressing  Physician Treatment Plan for Secondary Diagnosis: Principal Problem:   Acute psychosis (HCC) Active Problems:   Polysubstance abuse (HCC)  Long Term Goal(s): Improvement in symptoms so as ready for discharge   Short Term Goals: Ability to identify changes in lifestyle to reduce recurrence of condition will improve Ability to identify triggers associated with substance abuse/mental health issues will improve Ability to verbalize feelings will  improve Ability to disclose and discuss suicidal ideas Ability to demonstrate self-control will improve Ability to identify and develop effective coping behaviors will improve Ability to maintain clinical measurements within normal limits will improve Compliance with prescribed medications will improve     Medication Management: Evaluate patient's response, side effects, and tolerance of medication regimen.  Therapeutic Interventions: 1 to 1 sessions, Unit Group sessions and Medication administration.  Evaluation of Outcomes: Progressing   RN Treatment Plan for Primary Diagnosis: Acute psychosis (HCC) Long Term Goal(s): Knowledge of disease and therapeutic regimen to maintain health will improve  Short Term Goals: Ability to remain free from injury will improve, Ability to verbalize frustration and anger appropriately will improve, Ability to demonstrate self-control, Ability to participate in decision making will improve, Ability to verbalize feelings will improve, Ability to identify and develop effective coping behaviors will improve, and Compliance with prescribed medications will improve  Medication Management: RN will administer medications as ordered by provider, will assess and evaluate patient's response and provide education to patient for prescribed medication. RN will report any adverse and/or side effects to prescribing provider.  Therapeutic Interventions: 1 on 1 counseling sessions, Psychoeducation, Medication administration, Evaluate responses to treatment, Monitor vital signs and CBGs as ordered, Perform/monitor CIWA, COWS, AIMS and Fall Risk screenings as ordered, Perform wound care treatments as ordered.  Evaluation of Outcomes: Progressing   LCSW Treatment Plan for Primary Diagnosis: Acute psychosis (HCC) Long Term Goal(s): Safe transition to appropriate next level of care at discharge, Engage patient in therapeutic group addressing interpersonal concerns.  Short Term  Goals: Engage patient in aftercare planning with referrals and resources, Increase social support, Facilitate acceptance of mental health diagnosis and concerns, Identify triggers associated with mental health/substance abuse issues, and Increase skills for wellness and recovery  Therapeutic Interventions: Assess for all discharge needs, 1 to 1 time with Social worker, Explore available resources and support systems, Assess for adequacy in community support network, Educate family and significant other(s) on suicide prevention, Complete Psychosocial Assessment, Interpersonal group therapy.  Evaluation of Outcomes: Progressing   Progress in Treatment: Attending groups: Yes. Participating in groups: Yes. Taking medication as prescribed: Yes. Toleration medication: Yes. Family/Significant other contact made: No, will contact:  if given permission. Patient understands diagnosis: No. Discussing patient identified problems/goals with staff: Yes. Medical problems stabilized or resolved: Yes. Denies suicidal/homicidal ideation: Yes. Issues/concerns per patient self-inventory: No. Other: None.  New problem(s) identified: No, Describe:  none.  New Short Term/Long Term Goal(s): detox, elimination of symptoms of psychosis, medication management for mood stabilization; elimination of SI thoughts; development of comprehensive mental wellness/sobriety plan. Update 01/25/21: No changes at this time. Update 01/30/21: No changes at this time.   Patient Goals: "Fix myself a bit." Update 01/25/21: No changes at this time. Update 01/30/21: No changes at this time.   Discharge Plan or Barriers: CSW will assist pt with development of an appropriate aftercare/discharge plan. Update 01/25/21: No changes at this time. Update 01/30/21: No changes at this time.   Reason for Continuation of Hospitalization: Aggression Mania Medication stabilization  Estimated Length of Stay: TBD   Scribe for Treatment Team: Glenis Smoker, LCSW 01/30/2021 9:23 AM

## 2021-01-30 NOTE — Progress Notes (Signed)
Recreation Therapy Notes  Date: 01/30/2021  Time: 10:00 am   Location: Craft room   Behavioral response: Appropriate,Redirection needed   Intervention Topic: Self-care    Discussion/Intervention:  Group content today was focused on Self-Care. The group defined self-care and some positive ways they care for themselves. Individuals expressed ways and reasons why they neglected any self-care in the past. Patients described ways to improve self-care in the future. The group explained what could happen if they did not do any self-care activities at all. The group participated in the intervention "self-care assessment" where they had a chance to discover some of their weaknesses and strengths in self- care. Patient came up with a self-care plan to improve themselves in the future.  Clinical Observations/Feedback: Patient came to group and defined self-care as loving self. He stated he participates in self-care by using positive affirmations. Participant explained that he neglects self-care by hanging with the wrong crowd.Patient became argumentative during group with writer,needed redirection to focus on the task at hand.  Individual was social with peers and staff while participating in the intervention.  Kannen Moxey LRT/CTRS         Roxie Kreeger 01/30/2021 1:29 PM

## 2021-01-30 NOTE — Progress Notes (Signed)
   01/30/21 1625  Clinical Encounter Type  Visited With Patient  Visit Type Follow-up;Social support  Referral From Patient  Consult/Referral To Chaplain  Luna Fuse checked-in briefly during mealtime to respond to Pt's request to talk. Pt concerned about his footwear and shared his needs in case chaplain might have access to other options. Pt may also like to have an extended conversation so chaplain will follow-up on afternoon of 9/20.

## 2021-01-30 NOTE — Group Note (Signed)
[facility] LCSW Group Therapy Note    Group Date: 01/30/2021 Start Time: 1300 End Time: 1400  Type of Therapy and Topic:  Group Therapy:  Overcoming Obstacles  Participation Level:  [facility] PARTICIPATION LEVEL: Active  Mood: Irritable  Description of Group:   In this group patients will be encouraged to explore what they see as obstacles to their own wellness and recovery. They will be guided to discuss their thoughts, feelings, and behaviors related to these obstacles. The group will process together ways to cope with barriers, with attention given to specific choices patients can make. Each patient will be challenged to identify changes they are motivated to make in order to overcome their obstacles. This group will be process-oriented, with patients participating in exploration of their own experiences as well as giving and receiving support and challenge from other group members.  Therapeutic Goals: 1. Patient will identify personal and current obstacles as they relate to admission. 2. Patient will identify barriers that currently interfere with their wellness or overcoming obstacles.  3. Patient will identify feelings, thought process and behaviors related to these barriers. 4. Patient will identify two changes they are willing to make to overcome these obstacles:    Summary of Patient Progress Patient was present for the entirety of group. Remains tangential w/ loose associations to topic being discussed. Patient's speech remains pressured.    Therapeutic Modalities:   Cognitive Behavioral Therapy Solution Focused Therapy Motivational Interviewing Relapse Prevention Therapy   Corky Crafts, [place]

## 2021-01-30 NOTE — Plan of Care (Signed)
  Problem: Education: Goal: Knowledge of [place] [facility] Education information/materials will improve Outcome: Progressing Goal: Emotional status will improve Outcome: Progressing Goal: Mental status will improve Outcome: Progressing Goal: Verbalization of understanding the information provided will improve Outcome: Progressing   Problem: Activity: Goal: Interest or engagement in activities will improve Outcome: Progressing Goal: Sleeping patterns will improve Outcome: Progressing   Problem: Coping: Goal: Ability to verbalize frustrations and anger appropriately will improve Outcome: Progressing Goal: Ability to demonstrate self-control will improve Outcome: Progressing   

## 2021-01-30 NOTE — Progress Notes (Signed)
[facility] MD Progress Note  01/30/2021 2:35 PM Noah [name]  MRN:  [id number] Subjective: Follow-up for this 20 year old male who presented to the hospital with what appears to be new psychosis.  Depakote level came back 72.  Patient came in to see me today and immediately announced that there was nothing whatsoever wrong with him and never was.  This was rather disappointing since I had been hoping he might be close to discharge.  He was argumentative when I pointed out the objective psychotic symptoms that he was having when he came into the hospital.  Remains disorganized with some pressured speech.  Poor insight.  Not threatening however.  No suicidal ideation.  Cooperative apparently with medicine. Principal Problem: Acute psychosis (HCC) Diagnosis: Principal Problem:   Acute psychosis (HCC) Active Problems:   Polysubstance abuse (HCC)  Total Time spent with patient: 30 minutes  Past Psychiatric History: Past history of no previous treatment of significance none report  Past Medical History: History reviewed. No pertinent past medical history. History reviewed. No pertinent surgical history. Family History: History reviewed. No pertinent family history. Family Psychiatric  History: Did none reported Social History:  Social History   Substance and Sexual Activity  Alcohol Use No     Social History   Substance and Sexual Activity  Drug Use No    Social History   Socioeconomic History   Marital status: Single    Spouse name: Not on file   Number of children: Not on file   Years of education: Not on file   Highest education level: Not on file  Occupational History   Not on file  Tobacco Use   Smoking status: Some Days    Types: Cigarettes   Smokeless tobacco: Never  Vaping Use   Vaping Use: Every day  Substance and Sexual Activity   Alcohol use: No   Drug use: No   Sexual activity: Not on file  Other Topics Concern   Not on file  Social History Narrative   Not on  file   Social Determinants of Health   Financial Resource Strain: Not on file  Food Insecurity: Not on file  Transportation Needs: Not on file  Physical Activity: Not on file  Stress: Not on file  Social Connections: Not on file   Additional Social History:                         Sleep: Fair  Appetite:  Fair  Current Medications: Current Facility-Administered Medications  Medication Dose Route Frequency Provider Last Rate Last Admin   alum & mag hydroxide-simeth (MAALOX/MYLANTA) 200-200-20 MG/5ML suspension 30 mL  30 mL Oral Q4H PRN Talib Sans, MD   30 mL at 01/24/21 1959   divalproex (DEPAKOTE) DR tablet 1,000 mg  1,000 mg Oral QHS Eldridge Marcott T, MD       divalproex (DEPAKOTE) DR tablet 500 mg  500 mg Oral q AM Chilton Sans, MD   500 mg at 01/30/21 0754   feeding supplement (ENSURE ENLIVE / ENSURE PLUS) liquid 237 mL  237 mL Oral TID BM Stephan Sans, MD   237 mL at 01/30/21 1012   hydrOXYzine (ATARAX/VISTARIL) tablet 50 mg  50 mg Oral TID PRN Creedence Sans, MD   50 mg at 01/30/21 0331   ibuprofen (ADVIL) tablet 600 mg  600 mg Oral Q6H PRN Mansa Sans, MD   600 mg at 01/28/21 2109   magnesium hydroxide (  MILK OF MAGNESIA) suspension 30 mL  30 mL Oral Daily PRN Wilferd Sans, MD       multivitamin with minerals tablet 1 tablet  1 tablet Oral Daily Vondell Sans, MD   1 tablet at 01/30/21 0754   nicotine polacrilex (NICORETTE) gum 2 mg  2 mg Oral Q4H PRN Tejas Sans, MD   2 mg at 01/30/21 1740   OLANZapine (ZYPREXA) tablet 10 mg  10 mg Oral Q6H PRN Daiquan Sans, MD   10 mg at 01/30/21 0331   OLANZapine zydis (ZYPREXA) disintegrating tablet 20 mg  20 mg Oral QHS Zylon Sans, MD   20 mg at 01/29/21 2139   traZODone (DESYREL) tablet 100 mg  100 mg Oral QHS PRN Khiry Sans, MD   100 mg at 01/29/21 2138   ziprasidone (GEODON) injection 20 mg  20 mg Intramuscular Q6H PRN Anddy Sans, MD        Lab Results:  Results for  orders placed or performed during the hospital encounter of 01/18/21 (from the past 48 hour(s))  Valproic acid level     Status: None   Collection Time: 01/30/21  6:28 AM  Result Value Ref Range   Valproic Acid Lvl 72 50.0 - 100.0 ug/mL    Comment: Performed at [facility], [address] [place], [place] [address]    Blood Alcohol level:  Lab Results  Component Value Date   ETH 12 (H) 01/17/2021    Metabolic Disorder Labs: Lab Results  Component Value Date   HGBA1C 5.3 01/19/2021   MPG 105.41 01/19/2021   No results found for: PROLACTIN Lab Results  Component Value Date   CHOL 155 01/19/2021   TRIG 95 01/19/2021   HDL 58 01/19/2021   CHOLHDL 2.7 01/19/2021   VLDL 19 01/19/2021   LDLCALC 78 01/19/2021    Physical Findings: AIMS:  , ,  ,  ,    CIWA:    COWS:     Musculoskeletal: Strength & Muscle Tone: within normal limits Gait & Station: normal Patient leans: N/A  Psychiatric Specialty Exam:  Presentation  General Appearance: Appropriate for Environment; Casual  Eye Contact:Fair  Speech:Pressured  Speech Volume:Normal  Handedness:Right   Mood and Affect  Mood:Irritable  Affect:Congruent   Thought Process  Thought Processes:Goal Directed  Descriptions of Associations:Intact  Orientation:Full (Time, Place and Person)  Thought Content:Rumination  History of Schizophrenia/Schizoaffective disorder:No  Duration of Psychotic Symptoms:Less than six months  Hallucinations:No data recorded Ideas of Reference:None  Suicidal Thoughts:No data recorded Homicidal Thoughts:No data recorded  Sensorium  Memory:Immediate Fair; Recent Fair; Remote Fair  Judgment:Intact  Insight:Present   Executive Functions  Concentration:Fair  Attention Span:Fair  Recall:Fair  Fund of Knowledge:Fair  Language:Fair   Psychomotor Activity  Psychomotor Activity: No data recorded  Assets  Assets:Communication Skills; Physical Health;  Resilience; Social Support; Talents/Skills   Sleep  Sleep: No data recorded   Physical Exam: Physical Exam Vitals and nursing note reviewed.  Constitutional:      Appearance: Normal appearance.  HENT:     Head: Normocephalic and atraumatic.     Mouth/Throat:     Pharynx: Oropharynx is clear.  Eyes:     Pupils: Pupils are equal, round, and reactive to light.  Cardiovascular:     Rate and Rhythm: Normal rate and regular rhythm.  Pulmonary:     Effort: Pulmonary effort is normal.     Breath sounds: Normal breath sounds.  Abdominal:  General: Abdomen is flat.     Palpations: Abdomen is soft.  Musculoskeletal:        General: Normal range of motion.  Skin:    General: Skin is warm and dry.  Neurological:     General: No focal deficit present.     Mental Status: He is alert. Mental status is at baseline.  Psychiatric:        Mood and Affect: Mood normal.        Speech: He is noncommunicative.        Thought Content: Thought content normal.   Review of Systems  Constitutional: Negative.   HENT: Negative.    Eyes: Negative.   Respiratory: Negative.    Cardiovascular: Negative.   Gastrointestinal: Negative.   Musculoskeletal: Negative.   Skin: Negative.   Neurological: Negative.   Psychiatric/Behavioral: Negative.    Blood pressure 135/88, pulse (!) 106, temperature (!) 97.5 F (36.4 C), temperature source Oral, resp. rate 18, height 5\' 8"  (1.727 m), weight 54.4 kg, SpO2 100 %. Body mass index is 18.25 kg/m.   Treatment Plan Summary: Medication management and Plan increased the dose of his Depakote to 500 in the morning and 1000 at night to increase blood level and make the dosing more convenient.  Talked with him today trying to improve his insight going over the symptoms that brought him in.  Educating about the risks of noncompliance.  Patient's limited cognitive capacity as well as his ongoing manic symptoms clearly get in the way of his processing this very  well.  Nevertheless he is certainly improved from where he came in.  I still think we might look for discharge within a couple days.  , MD 01/30/2021, 2:35 PM

## 2021-01-30 NOTE — Progress Notes (Signed)
Active on the unit. Very hyper, pacing the halls being intrusive at times with the other patients on the unit.  Med compliant and received meds without  incident. Endorses anxiety and rates at 7/10 this evening.  Denies si/hi/avh. Will continue to monitor with q15 min safety checks.  Cleo Butler-Nicholson, LPN

## 2021-01-31 NOTE — Group Note (Signed)
[facility] LCSW Group Therapy Note   Group Date: 01/31/2021 Start Time: 1310 End Time: 1440  Type of Therapy/Topic:  Group Therapy:  Feelings about Diagnosis  Participation Level:  Did Not Attend    Description of Group:    This group will allow patients to explore their thoughts and feelings about diagnoses they have received. Patients will be guided to explore their level of understanding and acceptance of these diagnoses. Facilitator will encourage patients to process their thoughts and feelings about the reactions of others to their diagnosis, and will guide patients in identifying ways to discuss their diagnosis with significant others in their lives. This group will be process-oriented, with patients participating in exploration of their own experiences as well as giving and receiving support and challenge from other group members.   Therapeutic Goals: 1. Patient will demonstrate understanding of diagnosis as evidence by identifying two or more symptoms of the disorder:  2. Patient will be able to express two feelings regarding the diagnosis 3. Patient will demonstrate ability to communicate their needs through discussion and/or role plays  Summary of Patient Progress: X   Therapeutic Modalities:   Cognitive Behavioral Therapy Brief Therapy Feelings Identification    Yalitza Teed R Annjeanette Sarwar, LCSW 

## 2021-01-31 NOTE — Progress Notes (Signed)
Hyper focused on leaving. He has presented with a list of items that he needs for discharge. Reports looking forward to going go home to stay with his grandmother and being a better person.  Med compliant, He received his meds without incident. He denies si/hi/avh anxiety and depression at this encounter. Although he denies he presents anxious and actually asked for medication to help for anxiety an hour earlier. Noah [name] is active on the unit , pacing the halls and hanging out in the day room with his peers. Will continue to monitor and encouraged to seek nursing staff with any concerns.       Cleo Butler-Nicholson, LPN

## 2021-01-31 NOTE — Plan of Care (Addendum)
D: Patient alert and oriented this shift. Denies SI/HI/AH/VH. Rates pain 5/10. See MAR. Taking am medication. Reports anxiety 3/10. Denies depression.   A: Labs and vital signs monitored. Patient supported emotionally and encouraged to verbalize concerns.   R: No adverse reaction to medications noted. Cont Q 15 minute check for safety.    Problem: Education: Goal: Knowledge of [facility] Education information/materials will improve Outcome: Progressing Goal: Emotional status will improve Outcome: Progressing Goal: Mental status will improve Outcome: Progressing Goal: Verbalization of understanding the information provided will improve Outcome: Progressing   Problem: Activity: Goal: Interest or engagement in activities will improve Outcome: Progressing Goal: Sleeping patterns will improve Outcome: Progressing   Problem: Coping: Goal: Ability to verbalize frustrations and anger appropriately will improve Outcome: Progressing Goal: Ability to demonstrate self-control will improve Outcome: Progressing   Problem: Health Behavior/Discharge Planning: Goal: Identification of resources available to assist in meeting health care needs will improve Outcome: Progressing Goal: Compliance with treatment plan for underlying cause of condition will improve Outcome: Progressing   Problem: Safety: Goal: Periods of time without injury will increase Outcome: Progressing   Problem: Physical Regulation: Goal: Ability to maintain clinical measurements within normal limits will improve Outcome: Progressing   Problem: Activity: Goal: Will verbalize the importance of balancing activity with adequate rest periods Outcome: Progressing   Problem: Education: Goal: Will be free of psychotic symptoms Outcome: Progressing Goal: Knowledge of the prescribed therapeutic regimen will improve Outcome: Progressing

## 2021-01-31 NOTE — Progress Notes (Signed)
[facility] MD Progress Note  01/31/2021 10:21 AM Noah [name]  MRN:  [id number] Subjective: Patient seen chart reviewed.  Patient has no new complaints.  He is neatly dressed and acting appropriately today.  Interacts well with other patients on the unit.  Does not show any overtly bizarre behavior.  In conversation still lacks insight but has not been bizarre or indicating any hallucinations.  No dangerous behavior.  Tolerating medicine well. 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 No pertinent past medical history. History reviewed. No pertinent surgical history. Family History: History reviewed. No pertinent family history. Family Psychiatric  History: See previous Social History:  Social History   Substance and Sexual Activity  Alcohol Use No     Social History   Substance and Sexual Activity  Drug Use No    Social History   Socioeconomic History   Marital status: Single    Spouse name: Not on file   Number of children: Not on file   Years of education: Not on file   Highest education level: Not on file  Occupational History   Not on file  Tobacco Use   Smoking status: Some Days    Types: Cigarettes   Smokeless tobacco: Never  Vaping Use   Vaping Use: Every day  Substance and Sexual Activity   Alcohol use: No   Drug use: No   Sexual activity: Not on file  Other Topics Concern   Not on file  Social History Narrative   Not on file   Social Determinants of Health   Financial Resource Strain: Not on file  Food Insecurity: Not on file  Transportation Needs: Not on file  Physical Activity: Not on file  Stress: Not on file  Social Connections: Not on file   Additional Social  History:                         Sleep: Fair  Appetite:  Fair  Current Medications: Current Facility-Administered Medications  Medication Dose Route Frequency Provider Last Rate Last Admin   alum & mag hydroxide-simeth (MAALOX/MYLANTA) 200-200-20 MG/5ML suspension 30 mL  30 mL Oral Q4H PRN Lavar Sans, MD   30 mL at 01/24/21 1959   divalproex (DEPAKOTE) DR tablet 1,000 mg  1,000 mg Oral QHS Juergen Hardenbrook T, MD   1,000 mg at 01/30/21 2118   divalproex (DEPAKOTE) DR tablet 500 mg  500 mg Oral q AM Zac Sans, MD   500 mg at 01/31/21 0811   feeding supplement (ENSURE ENLIVE / ENSURE PLUS) liquid 237 mL  237 mL Oral TID BM Aloysuis Sans, MD   237 mL at 01/31/21 1007   hydrOXYzine (ATARAX/VISTARIL) tablet 50 mg  50 mg Oral TID PRN Billie Sans, MD   50 mg at 01/31/21 0813   ibuprofen (ADVIL) tablet 600 mg  600 mg Oral Q6H PRN Zadyn Sans, MD   600 mg at 01/31/21 6433   magnesium hydroxide (MILK OF MAGNESIA) suspension 30 mL  30 mL Oral Daily PRN Yedidya Sans, MD       multivitamin with minerals tablet 1 tablet  1 tablet Oral Daily Keshun Sans, MD   1 tablet at 01/31/21 [phone]  nicotine polacrilex (NICORETTE) gum 2 mg  2 mg Oral Q4H PRN Gaege Sans, MD   2 mg at 01/31/21 1008   OLANZapine (ZYPREXA) tablet 10 mg  10 mg Oral Q6H PRN Talbot Sans, MD   10 mg at 01/30/21 2007   OLANZapine zydis (ZYPREXA) disintegrating tablet 20 mg  20 mg Oral QHS Danel Sans, MD   20 mg at 01/30/21 2119   traZODone (DESYREL) tablet 100 mg  100 mg Oral QHS PRN Ismaeel Sans, MD   100 mg at 01/29/21 2138   ziprasidone (GEODON) injection 20 mg  20 mg Intramuscular Q6H PRN Oseias Sans, MD        Lab Results:  Results for orders placed or performed during the hospital encounter of 01/18/21 (from the past 48 hour(s))  Valproic acid level     Status: None   Collection Time: 01/30/21  6:28 AM  Result Value Ref Range   Valproic Acid Lvl 72 50.0 - 100.0 ug/mL     Comment: Performed at [facility], [address] [place], [place] [address]    Blood Alcohol level:  Lab Results  Component Value Date   ETH 12 (H) 01/17/2021    Metabolic Disorder Labs: Lab Results  Component Value Date   HGBA1C 5.3 01/19/2021   MPG 105.41 01/19/2021   No results found for: PROLACTIN Lab Results  Component Value Date   CHOL 155 01/19/2021   TRIG 95 01/19/2021   HDL 58 01/19/2021   CHOLHDL 2.7 01/19/2021   VLDL 19 01/19/2021   LDLCALC 78 01/19/2021    Physical Findings: AIMS:  , ,  ,  ,    CIWA:    COWS:     Musculoskeletal: Strength & Muscle Tone: within normal limits Gait & Station: normal Patient leans: N/A  Psychiatric Specialty Exam:  Presentation  General Appearance: Appropriate for Environment; Casual  Eye Contact:Fair  Speech:Pressured  Speech Volume:Normal  Handedness:Right   Mood and Affect  Mood:Irritable  Affect:Congruent   Thought Process  Thought Processes:Goal Directed  Descriptions of Associations:Intact  Orientation:Full (Time, Place and Person)  Thought Content:Rumination  History of Schizophrenia/Schizoaffective disorder:No  Duration of Psychotic Symptoms:Less than six months  Hallucinations:No data recorded Ideas of Reference:None  Suicidal Thoughts:No data recorded Homicidal Thoughts:No data recorded  Sensorium  Memory:Immediate Fair; Recent Fair; Remote Fair  Judgment:Intact  Insight:Present   Executive Functions  Concentration:Fair  Attention Span:Fair  Recall:Fair  Fund of Knowledge:Fair  Language:Fair   Psychomotor Activity  Psychomotor Activity: No data recorded  Assets  Assets:Communication Skills; Physical Health; Resilience; Social Support; Talents/Skills   Sleep  Sleep: No data recorded   Physical Exam: Physical Exam Vitals and nursing note reviewed.  Constitutional:      Appearance: Normal appearance.  HENT:     Head: Normocephalic and  atraumatic.     Mouth/Throat:     Pharynx: Oropharynx is clear.  Eyes:     Pupils: Pupils are equal, round, and reactive to light.  Cardiovascular:     Rate and Rhythm: Normal rate and regular rhythm.  Pulmonary:     Effort: Pulmonary effort is normal.     Breath sounds: Normal breath sounds.  Abdominal:     General: Abdomen is flat.     Palpations: Abdomen is soft.  Musculoskeletal:        General: Normal range of motion.  Skin:    General: Skin is warm and dry.  Neurological:     General: No  focal deficit present.     Mental Status: He is alert. Mental status is at baseline.  Psychiatric:        Attention and Perception: Attention normal.        Mood and Affect: Mood normal.        Speech: Speech is rapid and pressured.        Behavior: Behavior is cooperative.        Thought Content: Thought content normal.        Cognition and Memory: Memory is impaired.   Review of Systems  Constitutional: Negative.   HENT: Negative.    Eyes: Negative.   Respiratory: Negative.    Cardiovascular: Negative.   Gastrointestinal: Negative.   Musculoskeletal: Negative.   Skin: Negative.   Neurological: Negative.   Psychiatric/Behavioral: Negative.    Blood pressure (!) 144/91, pulse 99, temperature 98.2 F (36.8 C), temperature source Oral, resp. rate 18, height 5\' 8"  (1.727 m), weight 54.4 kg, SpO2 99 %. Body mass index is 18.25 kg/m.   Treatment Plan Summary: Medication management and Plan no change to medication.  Supportive counseling reviewing treatment plan and importance of treatment with patient who was less argumentative today.  We are tentatively planning on discharge tomorrow and he will then be [place].  Loraine Leriche, MD 01/31/2021, 10:21 AM

## 2021-01-31 NOTE — Progress Notes (Signed)
   01/31/21 1245  Clinical Encounter Type  Visited With Patient  Visit Type Follow-up;Social support  Referral From Patient  Consult/Referral To Chaplain  Spiritual Encounters  Spiritual Needs Other (Comment)  Chaplain Burris made brief follow-up with Pt for support. Pt indicated they wish to rest at this time. Will continue to follow as needed.

## 2021-02-01 MED ORDER — DIVALPROEX SODIUM 500 MG PO DR TAB: 1000.0000 mg | DELAYED_RELEASE_TABLET | Freq: Every day | ORAL | 1 refills | Status: AC

## 2021-02-01 MED ORDER — OLANZAPINE 20 MG PO TBDP: 20.0000 mg | ORAL_TABLET | Freq: Every day | ORAL | 1 refills | Status: AC

## 2021-02-01 MED ORDER — DIVALPROEX SODIUM 500 MG PO DR TAB: 500.0000 mg | DELAYED_RELEASE_TABLET | Freq: Every morning | ORAL | 1 refills | Status: AC

## 2021-02-01 NOTE — Progress Notes (Signed)
Patient present on unit this am. Eats breakfast and takes morning meds. Denies SI/HI/AH/VH, depression, anxiety and pain. No adverse reactions to medications noted. Labs and vital signs monitored and patient informed of discharge plans. Informed family of discharge. Will continue to monitor for safety.

## 2021-02-01 NOTE — Progress Notes (Signed)
  [facility] Adult Case Management Discharge Plan :  Will you be returning to the same living situation after discharge:  Yes,  Patient to return to place of residence.  At discharge, do you have transportation home?: Yes,  Family to assist with transportation.  Do you have the ability to pay for your medications: Yes,  [place] Medicaid  Release of information consent forms completed and in the chart;  Patient's signature needed at discharge.  Patient to Follow up at:  Follow-up Information     Services, [facility]. Go to.   Why: Please present for walk  in clinic. Contact information: [address] [place] [address] [phone]                 Next level of care provider has access to [facility] Link:no  Safety Planning and Suicide Prevention discussed: Yes,  SPE completed with patient, declined consent to reach collateral.      Has patient been referred to the Quitline?: Patient refused referral  Patient has been referred for addiction treatment: Pt. refused referral  Corky Crafts, LCSWA 02/01/2021, 9:52 AM

## 2021-02-01 NOTE — [facility] Suicide Risk Assessment (Signed)
[facility] Discharge Suicide Risk Assessment   Principal Problem: Acute psychosis (HCC) Discharge Diagnoses: Principal Problem:   Acute psychosis (HCC) Active Problems:   Polysubstance abuse (HCC)   Total Time spent with patient: 45 minutes  Musculoskeletal: Strength & Muscle Tone: within normal limits Gait & Station: normal Patient leans: N/A  Psychiatric Specialty Exam  Presentation  General Appearance: Appropriate for Environment; Casual  Eye Contact:Fair  Speech:Pressured  Speech Volume:Normal  Handedness:Right   Mood and Affect  Mood:Irritable  Duration of Depression Symptoms: Less than two weeks  Affect:Congruent   Thought Process  Thought Processes:Goal Directed  Descriptions of Associations:Intact  Orientation:Full (Time, Place and Person)  Thought Content:Rumination  History of Schizophrenia/Schizoaffective disorder:No  Duration of Psychotic Symptoms:Less than six months  Hallucinations:No data recorded Ideas of Reference:None  Suicidal Thoughts:No data recorded Homicidal Thoughts:No data recorded  Sensorium  Memory:Immediate Fair; Recent Fair; Remote Fair  Judgment:Intact  Insight:Present   Executive Functions  Concentration:Fair  Attention Span:Fair  Recall:Fair  Fund of Knowledge:Fair  Language:Fair   Psychomotor Activity  Psychomotor Activity: No data recorded  Assets  Assets:Communication Skills; Physical Health; Resilience; Social Support; Talents/Skills   Sleep  Sleep: No data recorded  Physical Exam: Physical Exam Vitals and nursing note reviewed.  Constitutional:      Appearance: Normal appearance.  HENT:     Head: Normocephalic and atraumatic.     Mouth/Throat:     Pharynx: Oropharynx is clear.  Eyes:     Pupils: Pupils are equal, round, and reactive to light.  Cardiovascular:     Rate and Rhythm: Normal rate and regular rhythm.  Pulmonary:     Effort: Pulmonary effort is normal.     Breath sounds:  Normal breath sounds.  Abdominal:     General: Abdomen is flat.     Palpations: Abdomen is soft.  Musculoskeletal:        General: Normal range of motion.  Skin:    General: Skin is warm and dry.  Neurological:     General: No focal deficit present.     Mental Status: He is alert. Mental status is at baseline.  Psychiatric:        Attention and Perception: Attention normal.        Mood and Affect: Mood normal.        Speech: Speech normal.        Behavior: Behavior is cooperative.        Thought Content: Thought content normal.        Cognition and Memory: Cognition normal.   Review of Systems  Constitutional: Negative.   HENT: Negative.    Eyes: Negative.   Respiratory: Negative.    Cardiovascular: Negative.   Gastrointestinal: Negative.   Musculoskeletal: Negative.   Skin: Negative.   Neurological: Negative.   Psychiatric/Behavioral: Negative.    Blood pressure 111/90, pulse (!) 101, temperature 97.6 F (36.4 C), temperature source Oral, resp. rate 17, height 5\' 8"  (1.727 m), weight 54.4 kg, SpO2 100 %. Body mass index is 18.25 kg/m.  Mental Status Per Nursing Assessment::   On Admission:  NA  Demographic Factors:  Male and Adolescent or young adult  Loss Factors: NA  Historical Factors: Impulsivity  Risk Reduction Factors:   Living with another person, especially a relative and Positive social support  Continued Clinical Symptoms:  Bipolar Disorder:   Mixed State  Cognitive Features That Contribute To Risk:  None    Suicide Risk:  Minimal: No identifiable suicidal ideation.  Patients presenting with  no risk factors but with morbid ruminations; may be classified as minimal risk based on the severity of the depressive symptoms    Plan Of Care/Follow-up recommendations:  Other:    Prescriptions given at discharge.  Patient instructed to follow-up with local mental health agency.  Medications reviewed.  Patient is upbeat and denies any suicidal ideation and  is not currently psychotic or disorganized.  Mordecai Rasmussen, MD 02/01/2021, 9:16 AM

## 2021-02-01 NOTE — Progress Notes (Signed)
Patient ID: Noah [name], male   DOB: 04-17-2001, 20 y.o.   MRN: [id number]  Patient ready for discharge. AVS, Patient Summary and Physician Risk assessment, prescriptions, appointments and belongings reviewed with patients. Patient verbalized understanding of how to take medication and confirmed all belongings are correct. Denies SI/HI/AH/VH at discharge. Discharged safely back to family and with all belongings

## 2021-02-01 NOTE — Plan of Care (Signed)
  Problem: Education: Goal: Knowledge of [facility] Education information/materials will improve Outcome: Progressing Goal: Emotional status will improve Outcome: Progressing Goal: Mental status will improve Outcome: Progressing Goal: Verbalization of understanding the information provided will improve Outcome: Progressing   Problem: Activity: Goal: Interest or engagement in activities will improve Outcome: Progressing Goal: Sleeping patterns will improve Outcome: Progressing   Problem: Coping: Goal: Ability to verbalize frustrations and anger appropriately will improve Outcome: Progressing Goal: Ability to demonstrate self-control will improve Outcome: Progressing   

## 2021-02-01 NOTE — Progress Notes (Signed)
Recreation Therapy Notes   Date: 02/01/2021  Time: 10:00 am   Location: Craft room   Behavioral response: Appropriate  Intervention Topic: Goals   Discussion/Intervention:  Group content on today was focused on goals. Patients described what goals are and how they define goals. Individuals expressed how they go about setting goals and reaching them. The group identified how important goals are and if they make short term goals to reach long term goals. Patients described how many goals they work on at a time and what affects them not reaching their goal. Individuals described how much time they put into planning and obtaining their goals. The group participated in the intervention "My Goal Board" and made personal goal boards to help them achieve their goal. Clinical Observations/Feedback: Patient came to group and defined goals as something you set for yourself and want to accomplish. He stated that he makes goals for himself by thinking of his failures and making a plan. Participant expressed that his goals are important to keep your mind focused.Individual was social with peers and staff while participating in the intervention.  Noah [name] LRT/CTRS           Noah [name] 02/01/2021 12:32 PM        Noah [name] 02/01/2021 12:32 PM

## 2021-02-01 NOTE — Discharge Summary (Signed)
Physician Discharge Summary Note  Patient:  Noah [name] is an 20 y.o., male MRN:  [id number] DOB:  03-Oct-2000 Patient phone:  [phone] (home)  Patient address:   [address] [place] [address],  Total Time spent with patient: 45 minutes  Date of Admission:  01/18/2021 Date of Discharge: 02/01/2021  Reason for Admission: Patient admitted psychotic disorganized thinking and bizarre behavior walking to [place] distance for no good reason.  Patient was manic with hyperactivity hyper verbality grandiose that he and poor self-care on admission  Principal Problem: Acute psychosis [facility]) Discharge Diagnoses: Principal Problem:   Acute psychosis (HCC) Active Problems:   Polysubstance abuse (HCC)   Past Psychiatric History: Family reports symptoms coming up for the last couple months no previous hospitalization  Past Medical History: History reviewed. No pertinent past medical history. History reviewed. No pertinent surgical history. Family History: History reviewed. No pertinent family history. Family Psychiatric  History: None reported Social History:  Social History   Substance and Sexual Activity  Alcohol Use No     Social History   Substance and Sexual Activity  Drug Use No    Social History   Socioeconomic History   Marital status: Single    Spouse name: Not on file   Number of children: Not on file   Years of education: Not on file   Highest education level: Not on file  Occupational History   Not on file  Tobacco Use   Smoking status: Some Days    Types: Cigarettes   Smokeless tobacco: Never  Vaping Use   Vaping Use: Every day  Substance and Sexual Activity   Alcohol use: No   Drug use: No   Sexual activity: Not on file  Other Topics Concern   Not on file  Social History Narrative   Not on file   Social Determinants of Health   Financial Resource Strain: Not on file  Food Insecurity: Not on file  Transportation Needs: Not on file  Physical  Activity: Not on file  Stress: Not on file  Social Connections: Not on file    Hospital Course: Patient admitted to psychiatric ward.  15-minute checks maintained.  Patient displayed manic like psychotic behaviors in the hospital.  He was started on antipsychotic and mood stabilizing medicine including Depakote and Zyprexa.  Largely cooperative with medicine.  Gradually showed improvement.  Did not engage in any violent hostile aggressive or suicidal behavior on the unit.  At the time of discharge patient is no longer disorganized or strange in his speech or behavior.  He expresses an understanding of his illness.  He agrees to continue medicine and to follow-up with local mental health agency.  We have communicated with his family since he has been here.  Patient will be given prescriptions for the olanzapine and Depakote at discharge.  Physical Findings: AIMS:  , ,  ,  ,    CIWA:    COWS:     Musculoskeletal: Strength & Muscle Tone: within normal limits Gait & Station: normal Patient leans: N/A   Psychiatric Specialty Exam:  Presentation  General Appearance: Appropriate for Environment; Casual  Eye Contact:Fair  Speech:Pressured  Speech Volume:Normal  Handedness:Right   Mood and Affect  Mood:Irritable  Affect:Congruent   Thought Process  Thought Processes:Goal Directed  Descriptions of Associations:Intact  Orientation:Full (Time, Place and Person)  Thought Content:Rumination  History of Schizophrenia/Schizoaffective disorder:No  Duration of Psychotic Symptoms:Less than six months  Hallucinations:No data recorded Ideas of Reference:None  Suicidal  Thoughts:No data recorded Homicidal Thoughts:No data recorded  Sensorium  Memory:Immediate Fair; Recent Fair; Remote Fair  Judgment:Intact  Insight:Present   Executive Functions  Concentration:Fair  Attention Span:Fair  Recall:Fair  Fund of Knowledge:Fair  Language:Fair   Psychomotor Activity   Psychomotor Activity: No data recorded  Assets  Assets:Communication Skills; Physical Health; Resilience; Social Support; Talents/Skills   Sleep  Sleep: No data recorded   Physical Exam: Physical Exam Vitals and nursing note reviewed.  Constitutional:      Appearance: Normal appearance.  HENT:     Head: Normocephalic and atraumatic.     Mouth/Throat:     Pharynx: Oropharynx is clear.  Eyes:     Pupils: Pupils are equal, round, and reactive to light.  Cardiovascular:     Rate and Rhythm: Normal rate and regular rhythm.  Pulmonary:     Effort: Pulmonary effort is normal.     Breath sounds: Normal breath sounds.  Abdominal:     General: Abdomen is flat.     Palpations: Abdomen is soft.  Musculoskeletal:        General: Normal range of motion.  Skin:    General: Skin is warm and dry.  Neurological:     General: No focal deficit present.     Mental Status: He is alert. Mental status is at baseline.  Psychiatric:        Mood and Affect: Mood normal.        Thought Content: Thought content normal.   Review of Systems  Constitutional: Negative.   HENT: Negative.    Eyes: Negative.   Respiratory: Negative.    Cardiovascular: Negative.   Gastrointestinal: Negative.   Musculoskeletal: Negative.   Skin: Negative.   Neurological: Negative.   Psychiatric/Behavioral: Negative.    Blood pressure 111/90, pulse (!) 101, temperature 97.6 F (36.4 C), temperature source Oral, resp. rate 17, height 5\' 8"  (1.727 m), weight 54.4 kg, SpO2 100 %. Body mass index is 18.25 kg/m.   Social History   Tobacco Use  Smoking Status Some Days   Types: Cigarettes  Smokeless Tobacco Never   Tobacco Cessation:  N/A, patient does not currently use tobacco products   Blood Alcohol level:  Lab Results  Component Value Date   ETH 12 (H) 01/17/2021    Metabolic Disorder Labs:  Lab Results  Component Value Date   HGBA1C 5.3 01/19/2021   MPG 105.41 01/19/2021   No results found  for: PROLACTIN Lab Results  Component Value Date   CHOL 155 01/19/2021   TRIG 95 01/19/2021   HDL 58 01/19/2021   CHOLHDL 2.7 01/19/2021   VLDL 19 01/19/2021   LDLCALC 78 01/19/2021    See Psychiatric Specialty Exam and Suicide Risk Assessment completed by Attending Physician prior to discharge.  Discharge destination:  Home  Is patient on multiple antipsychotic therapies at discharge:  No   Has Patient had three or more failed trials of antipsychotic monotherapy by history:  No  Recommended Plan for Multiple Antipsychotic Therapies: NA  Discharge Instructions     Diet - low sodium heart healthy   Complete by: As directed    Increase activity slowly   Complete by: As directed       Allergies as of 02/01/2021   No Known Allergies      Medication List     STOP taking these medications    ibuprofen 600 MG tablet Commonly known as: ADVIL       TAKE these medications  Indication  divalproex 500 MG DR tablet Commonly known as: DEPAKOTE Take 2 tablets (1,000 mg total) by mouth at bedtime.  Indication: Manic-Depression   divalproex 500 MG DR tablet Commonly known as: DEPAKOTE Take 1 tablet (500 mg total) by mouth in the morning. Start taking on: February 02, 2021  Indication: Manic-Depression   OLANZapine zydis 20 MG disintegrating tablet Commonly known as: ZYPREXA Take 1 tablet (20 mg total) by mouth at bedtime.  Indication: MIXED BIPOLAR AFFECTIVE DISORDER         Follow-up recommendations:  Other:    Prescriptions provided at discharge.  Follow up with day [facility].  Make sure her sleep habits are good.  Avoid abuse of drugs or alcohol  Comments: Patient agrees to discharge plan  Signed: Mordecai Rasmussen, MD 02/01/2021, 9:19 AM

## 2021-02-01 NOTE — Progress Notes (Signed)
Patient alert and oriented x 4, affect is blunted, thoughts are organized and coherent, he appears less anxious, less intrusive, speech is soft non pressure, he paced briefly and then went to his room. Patient currently denies SI/HI/AVH no distress noted, he's compliant with medication regimen. 15 minutes safety checks maintained will continue to monitor .

## 2022-05-19 IMAGING — DX DG WRIST COMPLETE 3+V*R*
4 series · 4 of 4 positions shown · non-contrast
Comparison: 10/09/2012

CLINICAL DATA: Right wrist pain and numbness.  Pain with movement.

EXAM:
RIGHT WRIST - COMPLETE 3+ VIEW

[wrist pa]
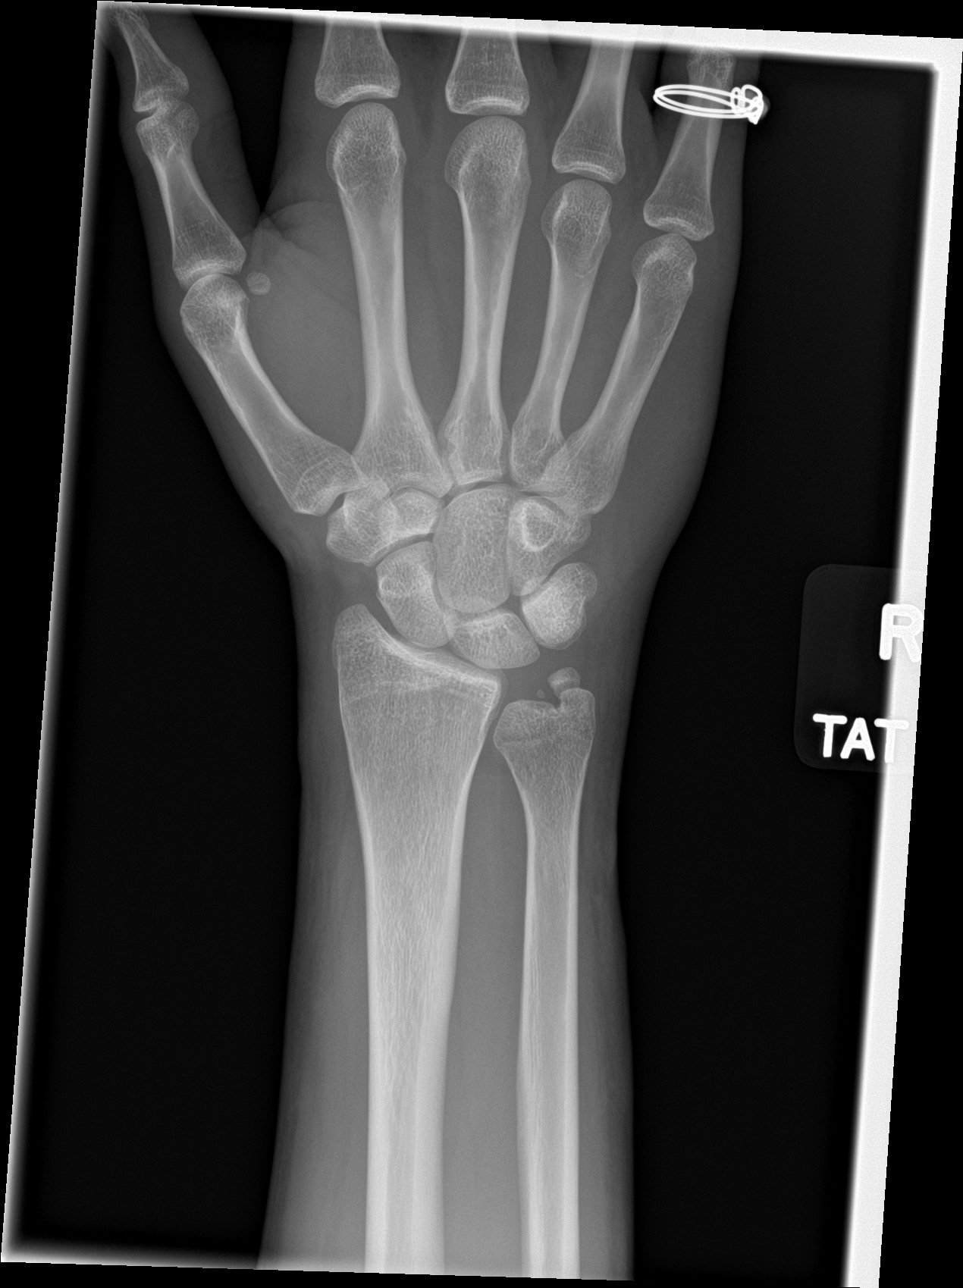

[wrist obl]
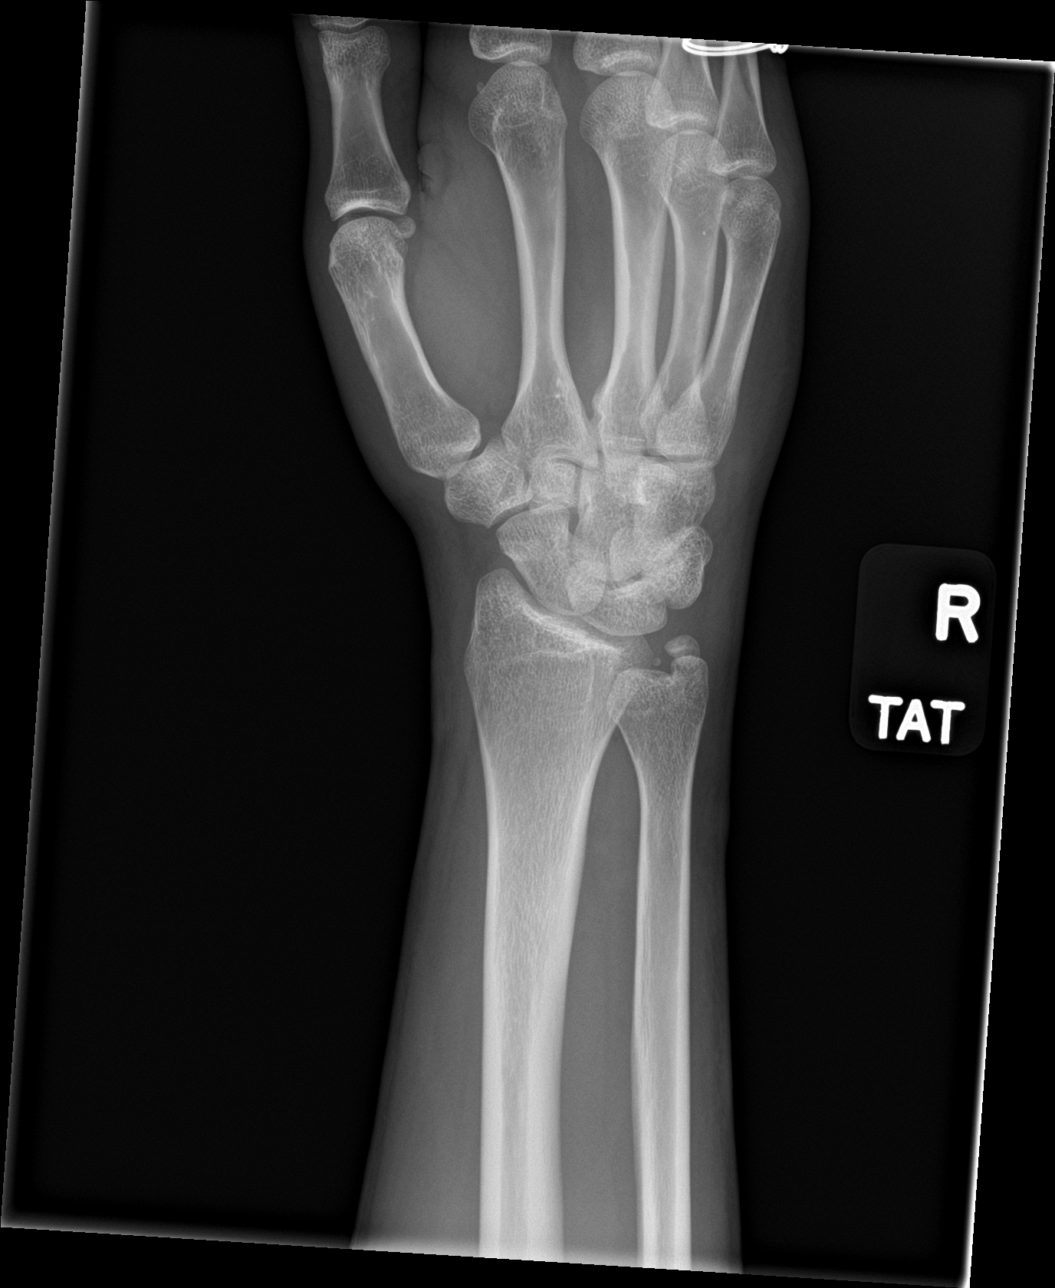

[wrist lat]
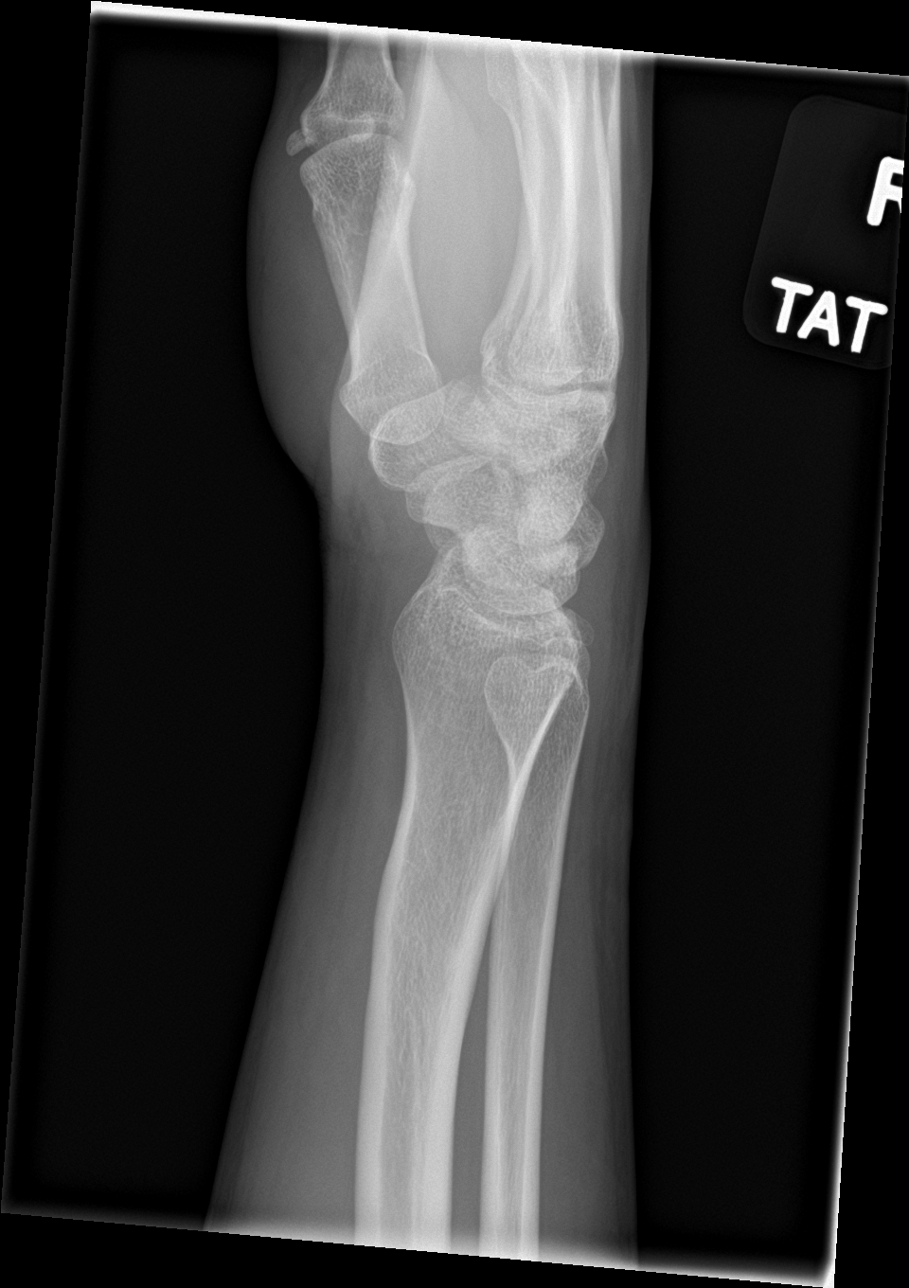

[wrist navicular]
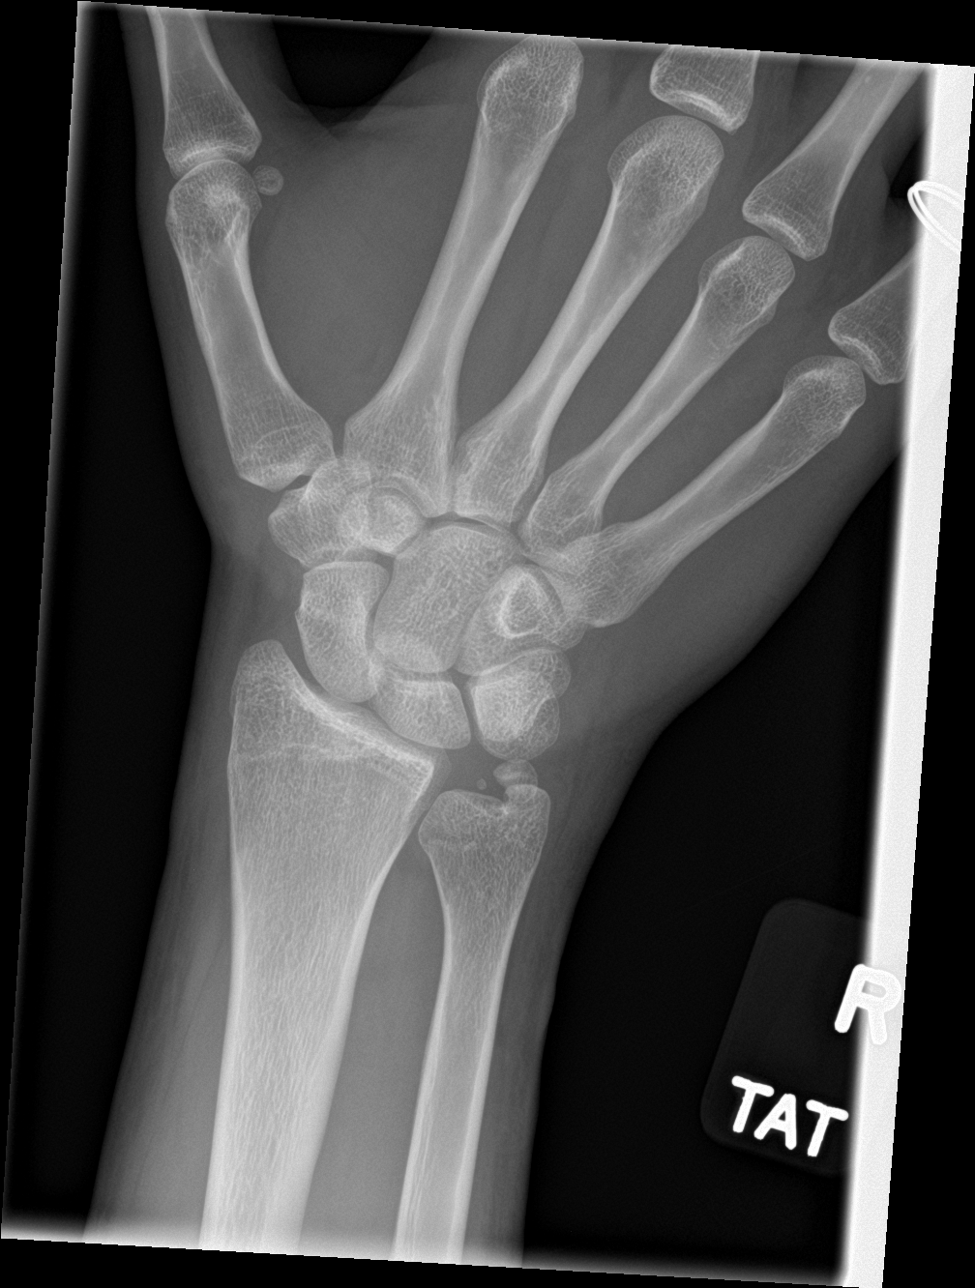

[4 of 4 positions shown; findings below may reference images not displayed]

FINDINGS: Mild bowing deformity in the distal radius probably related to old
injury. Two small calcifications along the distal ulna suggestive
for old injury based on the prior radiograph. Negative for an acute
fracture or dislocation. Carpal bones are intact.
IMPRESSION: 1. No acute abnormality to the right wrist.
2. Old posttraumatic changes involving the right wrist.
